# Patient Record
Sex: Female | Born: 1950
Health system: Southern US, Community
[De-identification: ages and names within clinical notes are randomized; demographics above are authoritative.]

## PROBLEM LIST (undated history)

## (undated) DIAGNOSIS — Z9889 Other specified postprocedural states: Secondary | ICD-10-CM

## (undated) DIAGNOSIS — T7840XA Allergy, unspecified, initial encounter: Secondary | ICD-10-CM

## (undated) DIAGNOSIS — N809 Endometriosis, unspecified: Secondary | ICD-10-CM

## (undated) DIAGNOSIS — K5790 Diverticulosis of intestine, part unspecified, without perforation or abscess without bleeding: Secondary | ICD-10-CM

## (undated) DIAGNOSIS — Z87448 Personal history of other diseases of urinary system: Secondary | ICD-10-CM

## (undated) DIAGNOSIS — F32A Depression, unspecified: Secondary | ICD-10-CM

## (undated) DIAGNOSIS — J302 Other seasonal allergic rhinitis: Secondary | ICD-10-CM

## (undated) DIAGNOSIS — N289 Disorder of kidney and ureter, unspecified: Secondary | ICD-10-CM

## (undated) DIAGNOSIS — R202 Paresthesia of skin: Secondary | ICD-10-CM

## (undated) DIAGNOSIS — I10 Essential (primary) hypertension: Secondary | ICD-10-CM

## (undated) DIAGNOSIS — D126 Benign neoplasm of colon, unspecified: Secondary | ICD-10-CM

## (undated) DIAGNOSIS — M1990 Unspecified osteoarthritis, unspecified site: Secondary | ICD-10-CM

## (undated) DIAGNOSIS — F329 Major depressive disorder, single episode, unspecified: Secondary | ICD-10-CM

## (undated) DIAGNOSIS — K529 Noninfective gastroenteritis and colitis, unspecified: Secondary | ICD-10-CM

## (undated) HISTORY — PX: URETERAL STENT PLACEMENT: SHX822

## (undated) HISTORY — DX: Other seasonal allergic rhinitis: J30.2

## (undated) HISTORY — DX: Paresthesia of skin: R20.2

## (undated) HISTORY — DX: Other specified postprocedural states: Z98.890

## (undated) HISTORY — DX: Endometriosis, unspecified: N80.9

## (undated) HISTORY — DX: Allergy, unspecified, initial encounter: T78.40XA

## (undated) HISTORY — DX: Unspecified osteoarthritis, unspecified site: M19.90

## (undated) HISTORY — DX: Disorder of kidney and ureter, unspecified: N28.9

## (undated) HISTORY — PX: DILATION AND CURETTAGE OF UTERUS: SHX78

## (undated) HISTORY — DX: Depression, unspecified: F32.A

## (undated) HISTORY — DX: Benign neoplasm of colon, unspecified: D12.6

## (undated) HISTORY — DX: Essential (primary) hypertension: I10

## (undated) HISTORY — DX: Personal history of other diseases of urinary system: Z87.448

## (undated) HISTORY — DX: Major depressive disorder, single episode, unspecified: F32.9

## (undated) HISTORY — DX: Diverticulosis of intestine, part unspecified, without perforation or abscess without bleeding: K57.90

---
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
## 1992-10-19 DIAGNOSIS — N809 Endometriosis, unspecified: Secondary | ICD-10-CM

## 1992-10-19 HISTORY — PX: ABDOMINAL HYSTERECTOMY: SHX81

## 1992-10-19 HISTORY — DX: Endometriosis, unspecified: N80.9

## 2000-12-10 ENCOUNTER — Encounter: Payer: Self-pay | Admitting: *Deleted

## 2000-12-10 ENCOUNTER — Ambulatory Visit (HOSPITAL_COMMUNITY): Admission: RE | Admit: 2000-12-10 | Discharge: 2000-12-10 | Payer: Self-pay | Admitting: *Deleted

## 2005-06-12 ENCOUNTER — Other Ambulatory Visit: Admission: RE | Admit: 2005-06-12 | Discharge: 2005-06-12 | Payer: Self-pay | Admitting: Family Medicine

## 2007-12-14 ENCOUNTER — Ambulatory Visit (HOSPITAL_COMMUNITY): Admission: RE | Admit: 2007-12-14 | Discharge: 2007-12-14 | Payer: Self-pay | Admitting: Family Medicine

## 2010-11-09 ENCOUNTER — Encounter: Payer: Self-pay | Admitting: Family Medicine

## 2012-07-19 ENCOUNTER — Encounter: Payer: Self-pay | Admitting: Family Medicine

## 2012-07-19 ENCOUNTER — Ambulatory Visit: Payer: Self-pay | Admitting: Family Medicine

## 2012-07-19 ENCOUNTER — Ambulatory Visit (INDEPENDENT_AMBULATORY_CARE_PROVIDER_SITE_OTHER): Payer: BC Managed Care – PPO | Admitting: Family Medicine

## 2012-07-19 VITALS — BP 130/78 | HR 66 | Temp 98.1°F | Resp 16 | Ht 61.0 in | Wt 119.6 lb

## 2012-07-19 DIAGNOSIS — Z720 Tobacco use: Secondary | ICD-10-CM

## 2012-07-19 DIAGNOSIS — R5383 Other fatigue: Secondary | ICD-10-CM

## 2012-07-19 DIAGNOSIS — I10 Essential (primary) hypertension: Secondary | ICD-10-CM

## 2012-07-19 MED ORDER — HYDROCHLOROTHIAZIDE 25 MG PO TABS: 25.0000 mg | ORAL_TABLET | Freq: Every day | ORAL | Status: DC

## 2012-07-19 NOTE — Progress Notes (Signed)
[address]   [place], [place]  [address]   [phone]  Subjective:    Patient ID: Kelly Norris, female    DOB: [birth year], 61 y.o.   MRN: [id number]  HPI This 61 y.o. female presents to establish care.    1.  Hypertension:  Sister is Danella Penton. Needs a new physician.  Today really just needs blood pressure medication refilled.    2. Fatigue:  Chronic issue for patient.  Etiology unclear.  S/p labs.  S/p stress testing for chest pain in past 1-2 years.  No apparent depression.      PMH: HTN; colitis? IBS? Psurg:  1.  Hysterecotmy tumor benign.  2.  Colonoscopy never. All: epinephrine (rash, tongue swelling),  Medications:  HCTZ Social: divored; not dating; would love to date.  +2 children; 2 grandchildren; works at [employer] x 1 year.  +tobacco 2-3 cigarettes per day.  No alcohol.  Exercise walking during lunch  Family:  M-lung mass/cancer with metastasis; died age 93.  Breast cancer in 40s.  Father-died 87; CHF.  Siblings--- sister with migraines, GERD, anxiety and depression, DDD cervical spine.  Brother-hypercholesterolemia, GERD, anxiety. Review of Systems  Constitutional: Positive for fatigue. Negative for chills, diaphoresis, activity change, appetite change and unexpected weight change.  HENT: Negative for congestion, rhinorrhea, sneezing, mouth sores, postnasal drip and sinus pressure.   Eyes: Negative for photophobia, pain and visual disturbance.  Respiratory: Negative for chest tightness, shortness of breath, wheezing and stridor.   Cardiovascular: Negative for chest pain, palpitations and leg swelling.  Gastrointestinal: Positive for abdominal pain, diarrhea and constipation. Negative for nausea, vomiting, blood in stool and anal bleeding.  Musculoskeletal: Negative for myalgias, back pain, joint swelling, arthralgias and gait problem.  Neurological: Negative for dizziness, tremors, seizures, syncope, facial asymmetry, weakness,  light-headedness, numbness and headaches.  Psychiatric/Behavioral: Negative for self-injury and dysphoric mood. The patient is not nervous/anxious.         Past Medical History  Diagnosis Date  . Hypertension     2004  . Osteoarthritis     cervical   . H/O hydronephrosis     left   . Paresthesias     thumb  . Kidney trouble     previous stent in the left kidney  . Seasonal allergies   . H/O breast biopsy     x 3  . Endometriosis 1994    Past Surgical History  Procedure Date  . Abdominal hysterectomy     Prior to Admission medications   Medication Sig Start Date End Date Taking? Authorizing Provider  fish oil-omega-3 fatty acids 1000 MG capsule Take 2 g by mouth daily. Take 1200 mg/day   Yes Historical Provider, MD  hydrochlorothiazide (HYDRODIURIL) 25 MG tablet Take 1 tablet (25 mg total) by mouth daily. Need refills 07/19/12  Yes Ethelda Chick, MD  Multiple Vitamins-Minerals (WOMENS 50+ ADVANCED PO) Take by mouth.   Yes Historical Provider, MD    Allergies  Allergen Reactions  . Epinephrine Other (See Comments)    Per pt start shaking and mouth clicks tongue gets fat  . Formaldehyde Hives and Other (See Comments)    Made in creams     History   Social History  . Marital Status: Divorced    Spouse Name: N/A    Number of Children: N/A  . Years of Education: N/A   Occupational History  . Not on file.   Social History Main Topics  . Smoking status: Current  Every Day Smoker    Types: Cigarettes  . Smokeless tobacco: Not on file     Comment: smoke 2-3 cigarettes/day  . Alcohol Use: No  . Drug Use: Not on file  . Sexually Active: Not on file   Other Topics Concern  . 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 She rarely uses alcohol. She has been divorced for over 16 years. She has 2 grown daughters.    Family History  Problem Relation Age of Onset  . Hypertension Mother   .  Breast cancer Mother     age 87  . Hypertension Father   . Prostate cancer Father   . Heart disease Father     bypass surgery in his late 4 's  . Valvular heart disease Daughter     valvular heart disease bacterial endocarditis but no premature history  of cardiac  disease    Objective:   Physical Exam  Nursing note and vitals reviewed. Constitutional: She is oriented to person, place, and time. She appears well-developed and well-nourished.  HENT:  Head: Normocephalic and atraumatic.  Right Ear: External ear normal.  Left Ear: External ear normal.  Nose: Nose normal.  Mouth/Throat: Oropharynx is clear and moist.  Eyes: Conjunctivae normal and EOM are normal. Pupils are equal, round, and reactive to light.  Neck: Normal range of motion. Neck supple. No JVD present. No thyromegaly present.  Cardiovascular: Normal rate, regular rhythm, normal heart sounds and intact distal pulses.  Exam reveals no gallop and no friction rub.   No murmur heard. Pulmonary/Chest: Effort normal and breath sounds normal.  Abdominal: Soft. Bowel sounds are normal. She exhibits no mass. There is no tenderness. There is no rebound and no guarding.  Lymphadenopathy:    She has no cervical adenopathy.  Neurological: She is alert and oriented to person, place, and time. She has normal reflexes. No cranial nerve deficit. She exhibits normal muscle tone. Coordination normal.  Skin: Skin is warm and dry. No rash noted. No erythema.  Psychiatric: She has a normal mood and affect. Her behavior is normal. Judgment and thought content normal.      Assessment & Plan:

## 2012-07-19 NOTE — Patient Instructions (Addendum)
1. Hypertension  hydrochlorothiazide (HYDRODIURIL) 25 MG tablet

## 2012-09-23 ENCOUNTER — Encounter: Payer: Self-pay | Admitting: Family Medicine

## 2012-09-23 DIAGNOSIS — I10 Essential (primary) hypertension: Secondary | ICD-10-CM | POA: Insufficient documentation

## 2012-09-23 DIAGNOSIS — Z87891 Personal history of nicotine dependence: Secondary | ICD-10-CM | POA: Insufficient documentation

## 2012-09-23 DIAGNOSIS — R5383 Other fatigue: Secondary | ICD-10-CM | POA: Insufficient documentation

## 2012-09-23 NOTE — Assessment & Plan Note (Signed)
Precontemplative

## 2012-09-23 NOTE — Assessment & Plan Note (Signed)
New.  S/p labs, cardiac evaluation without etiology revealed.  RTC in 3-6 months to discuss further.  Obtain records.

## 2012-09-23 NOTE — Assessment & Plan Note (Signed)
Controlled; no change in management; obtain labs next visit.

## 2012-09-24 NOTE — Progress Notes (Signed)
Reviewed and agree.

## 2013-01-24 ENCOUNTER — Telehealth: Payer: Self-pay

## 2013-01-24 DIAGNOSIS — I10 Essential (primary) hypertension: Secondary | ICD-10-CM

## 2013-01-24 NOTE — Telephone Encounter (Signed)
Dr. Katrinka Blazing: patient called regarding why she only had a 3 month prescription for her Blood pressure meds. She wanted to book a CPE to get refills but you are booked out on physicals until around July.   I did offer her an office visit but she wanted her questions answered first. Please call her at home after 6:30 at [phone] to discuss why she did not get a prescription for at least 6 months. Thank you

## 2013-01-26 MED ORDER — HYDROCHLOROTHIAZIDE 25 MG PO TABS: 25.0000 mg | ORAL_TABLET | Freq: Every day | ORAL | Status: DC

## 2013-01-26 NOTE — Telephone Encounter (Signed)
Call --- at her visit in 07/2012, I provided her with 90 day supply of blood pressure medication with one refill which should have lasted her a total of 6 months (which is now).  She is due for six month follow-up for high blood pressure.  It is my policy to follow all patients with high blood pressure every six months minimum.  I am happy to give her an additional refill until July if she wants to schedule a  Physical; but for the future, she will need visits every six months.

## 2013-01-26 NOTE — Telephone Encounter (Signed)
Called her , left message for her to call me back, sent in Rx for her to last until July

## 2013-01-27 NOTE — Telephone Encounter (Signed)
Left another message for her, to call me

## 2013-01-30 NOTE — Telephone Encounter (Signed)
Left another message, patient not returning my calls.

## 2015-05-09 ENCOUNTER — Emergency Department
Admission: EM | Admit: 2015-05-09 | Discharge: 2015-05-09 | Disposition: A | Discharge status: Home / Self Care | Payer: Commercial Managed Care - PPO | Attending: Emergency Medicine | Admitting: Emergency Medicine

## 2015-05-09 DIAGNOSIS — R233 Spontaneous ecchymoses: Secondary | ICD-10-CM | POA: Diagnosis present

## 2015-05-09 DIAGNOSIS — Z79899 Other long term (current) drug therapy: Secondary | ICD-10-CM | POA: Insufficient documentation

## 2015-05-09 DIAGNOSIS — I10 Essential (primary) hypertension: Secondary | ICD-10-CM | POA: Insufficient documentation

## 2015-05-09 DIAGNOSIS — Z87891 Personal history of nicotine dependence: Secondary | ICD-10-CM | POA: Diagnosis not present

## 2015-05-09 NOTE — ED Provider Notes (Signed)
[facility] Emergency Department Provider Note  ____________________________________________  Time seen: 7:30 PM  I have reviewed the triage vital signs and the nursing notes.   HISTORY  Chief Complaint Bleeding/Bruising    HPI Kelly Norris is a 64 y.o. female who complains of discoloration of the left third finger for the past 3 days. She notes that she has had this problem in the past that seems to occur spontaneously and has affected all of her fingers at different points. At the beginning, she felt like there was stiffness and swelling in the PIP joint of the left third finger with a small palpable bulge, and then at one point it felt like there was a pop and then subsequently dark reddish discoloration of the entire finger which has tracked down into the distal palm at the base of the finger. No coldness or pain in the finger. No fever or chills. She has full range of motion in the finger. No recent trauma that she can relate.  She does report a history of hemophilia like symptoms including easy bruising and severe bruising with minor trauma. She has been tested for hemophilia which was negative, but notes that she is "a free bleeder".She also reports that when this happens in the fingers, the reddish discoloration usually goes through a rainbow of colors as it involves before finally goes away. She does not take any aspirin or blood thinners    Past Medical History  Diagnosis Date  . Hypertension     2004  . Osteoarthritis     cervical   . H/O hydronephrosis     left   . Paresthesias     thumb  . Kidney trouble     previous stent in the left kidney  . Seasonal allergies   . H/O breast biopsy     x 3  . Endometriosis 1994    Patient Active Problem List   Diagnosis Date Noted  . Hypertension 09/23/2012  . Fatigue 09/23/2012  . Tobacco abuse 09/23/2012    Past Surgical History  Procedure Laterality Date  . Abdominal hysterectomy       Current Outpatient Rx  Name  Route  Sig  Dispense  Refill  . fish oil-omega-3 fatty acids 1000 MG capsule   Oral   Take 2 g by mouth daily. Take 1200 mg/day         . hydrochlorothiazide (HYDRODIURIL) 25 MG tablet   Oral   Take 1 tablet (25 mg total) by mouth daily.   90 tablet   0     Needs office visit for refills.   . Multiple Vitamins-Minerals (WOMENS 50+ ADVANCED PO)   Oral   Take by mouth.           Allergies Epinephrine and Formaldehyde  Family History  Problem Relation Age of Onset  . Hypertension Mother   . Breast cancer Mother     age 9  . Hypertension Father   . Prostate cancer Father   . Heart disease Father     bypass surgery in his late 53 's  . Valvular heart disease Daughter     valvular heart disease bacterial endocarditis but no premature history  of cardiac  disease    Social History History  Substance Use Topics  . Smoking status: Former Smoker    Types: Cigarettes  . Smokeless tobacco: Not on file     Comment: smoke 2-3 cigarettes/day  . Alcohol Use: No    Review of  Systems  Constitutional: No fever or chills. No weight changes Eyes:No blurry vision or double vision.  ENT: No sore throat. Cardiovascular: No chest pain. Respiratory: No dyspnea or cough. Gastrointestinal: Negative for abdominal pain, vomiting and diarrhea.  No BRBPR or melena. Genitourinary: Negative for dysuria, urinary retention, bloody urine, or difficulty urinating. Musculoskeletal: Negative for back pain. Left third finger swelling and discoloration as above Skin: Negative for rash. Neurological: Negative for headaches, focal weakness or numbness. Psychiatric:No anxiety or depression.   Endocrine:No hot/cold intolerance, changes in energy, or sleep difficulty.  10-point ROS otherwise negative.  ____________________________________________   PHYSICAL EXAM:  VITAL SIGNS: ED Triage Vitals  Enc Vitals Group     BP 05/09/15 1926 145/91 mmHg      Pulse Rate 05/09/15 1926 83     Resp 05/09/15 1926 18     Temp 05/09/15 1930 98.4 F (36.9 C)     Temp Source 05/09/15 1930 Oral     SpO2 05/09/15 1926 97 %     Weight 05/09/15 1926 130 lb (58.968 kg)     Height 05/09/15 1926 5\' 1"  (1.549 m)     Head Cir --      Peak Flow --      Pain Score 05/09/15 1929 0     Pain Loc --      Pain Edu? --      Excl. in [place]? --      Constitutional: Alert and oriented. Well appearing and in no distress. Musculoskeletal: Nontender with normal range of motion in all extremities. No joint effusions.  The right hand is normal. The left hand has no swelling or bony tenderness. There is full range of motion in all joints and full flexor and extensor function at all joints including in the left third finger. There is discoloration of the left third finger from the proximal end of the nailbed all the way into the distal palm overlying the area of the MCP joint.  There is normal cap refill in the nail bed and on the fingertip. All joints are stable and mobile. The skin is warm and dry.  Neurologic:   Motor and sensation intact Skin:  Skin is warm, dry and intact. No rash noted.  No petechiae, purpura, or bullae. Psychiatric: Mood and affect are normal. Speech and behavior are normal. Patient exhibits appropriate insight and judgment.  ____________________________________________    LABS (pertinent positives/negatives) (all labs ordered are listed, but only abnormal results are displayed) Labs Reviewed - No data to display ____________________________________________   EKG    ____________________________________________    RADIOLOGY    ____________________________________________   PROCEDURES  ____________________________________________   INITIAL IMPRESSION / ASSESSMENT AND PLAN / ED COURSE  Pertinent labs & imaging results that were available during my care of the patient were reviewed by me and considered in my medical decision making  (see chart for details).  Patient presents with discoloration of the left third finger which does not appear to be fracture dislocation tenosynovitis or other soft tissue infection. It actually appears to be consistent with a spontaneous subcutaneous hemorrhage that has tracked along the tendon sheaths causing a ecchymotic discoloration of the full finger. There is no evidence of compartment syndrome. I counseled the patient to use hot compresses to speed the breakdown of blood products. This appears to be a self-limited issue that will resolve on its own.  ____________________________________________   FINAL CLINICAL IMPRESSION(S) / ED DIAGNOSES  Final diagnoses:  Spontaneous bruising   of the  left third finger    Carrie Mew, MD 05/09/15 [phone]

## 2015-05-09 NOTE — ED Notes (Signed)
Patient has hx of the same. Has happened to all other fingers but never the thumbs. PA at Urgent Care suggested she come here for ultrasound.

## 2015-11-05 ENCOUNTER — Telehealth: Payer: Self-pay | Admitting: Family Medicine

## 2015-11-05 NOTE — Telephone Encounter (Signed)
Ok to schedule. Thanks

## 2015-11-05 NOTE — Telephone Encounter (Signed)
This lady wants to know if you would consider taking her on as a new patient.  Currently she is taking medication for high blood pressure.   She also has an issue with bruising and free bleeding but does take medication for that.  She needs a primary care provider.  She use to go to [facility] but the provider she seen there has left..    She does not need to be seen right away March or April is ok.   Her call back is [phone]  Thanks Con Memos

## 2015-11-05 NOTE — Telephone Encounter (Signed)
Please review. Thanks!  

## 2015-11-06 NOTE — Telephone Encounter (Signed)
Pt is coming in March 23 to establish care.

## 2016-01-09 ENCOUNTER — Ambulatory Visit (INDEPENDENT_AMBULATORY_CARE_PROVIDER_SITE_OTHER): Payer: Managed Care, Other (non HMO) | Admitting: Family Medicine

## 2016-01-09 ENCOUNTER — Encounter: Payer: Self-pay | Admitting: Family Medicine

## 2016-01-09 ENCOUNTER — Other Ambulatory Visit: Payer: Self-pay

## 2016-01-09 VITALS — BP 122/82 | HR 80 | Temp 98.1°F | Resp 16 | Ht 62.0 in | Wt 139.0 lb

## 2016-01-09 DIAGNOSIS — E038 Other specified hypothyroidism: Secondary | ICD-10-CM

## 2016-01-09 DIAGNOSIS — G4700 Insomnia, unspecified: Secondary | ICD-10-CM

## 2016-01-09 DIAGNOSIS — R195 Other fecal abnormalities: Secondary | ICD-10-CM | POA: Diagnosis not present

## 2016-01-09 DIAGNOSIS — I10 Essential (primary) hypertension: Secondary | ICD-10-CM | POA: Diagnosis not present

## 2016-01-09 DIAGNOSIS — E039 Hypothyroidism, unspecified: Secondary | ICD-10-CM

## 2016-01-09 DIAGNOSIS — Z1211 Encounter for screening for malignant neoplasm of colon: Secondary | ICD-10-CM | POA: Diagnosis not present

## 2016-01-09 MED ORDER — HYDROCHLOROTHIAZIDE 25 MG PO TABS: ORAL_TABLET | ORAL | Status: DC

## 2016-01-09 MED ORDER — AMITRIPTYLINE HCL 25 MG PO TABS: 25.0000 mg | ORAL_TABLET | Freq: Every day | ORAL | Status: DC

## 2016-01-09 NOTE — Progress Notes (Signed)
Patient ID: Kelly Norris, female   DOB: [birth year], 65 y.o.   MRN: [id number]       [name] Female    DOB: 01-16-1951   65 y.o.   MRN: [id number] Visit Date: 01/09/2016  Today's Provider: Margarita Rana, MD   Chief Complaint  Patient presents with  . New Patient (Initial Visit)  . Hypertension  . 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   Hypertension, follow-up:  BP Readings from Last 3 Encounters:  01/09/16 122/82  05/09/15 145/91  07/19/12 130/78    She was last seen for hypertension 1 years ago. Patient was seen by Dionisio David PA Management changes since that visit include na changes. She reports excellent compliance with treatment. She is not having side effects.  She is not exercising. She is adherent to low salt diet.   Outside blood pressures are not being checked. She is experiencing lower extremity edema.  Patient denies chest pain.   Cardiovascular risk factors include none.  Use of agents associated with hypertension: none.     Weight trend: increasing steadily Wt Readings from Last 3 Encounters:  01/09/16 139 lb (63.05 kg)  05/09/15 130 lb (58.968 kg)  07/19/12 119 lb 9.6 oz (54.25 kg)    Current diet: in general, a "healthy" diet    ------------------------------------------------------------------------   Depression, Follow-up  She  was last seen for this 1 years ago. Patient was seen by Dionisio David PA. Changes made at last visit include decreasing Amitriptyline to 25 mg daily.   She reports excellent compliance with treatment. She is not having side effects.  She reports excellent tolerance of treatment. Current symptoms include: insomnia She feels she is Unchanged since last visit. Patient reports that she started taking Amitriptyline for depression and now is taking it for  insomnia. ------------------------------------------------------------------------  Abnormal bowel movents: Patient reports that she has a history of colitis, diagnosed by Dr. Shon Baton in her early 39. Patient reports diarrhea and loose stools daily and a lot of gas. Patient denies blood in stool today. Patient had a bad flare up of colitis in October 2016. Patient reports she takes OTC probiotic twice a day. Patient reports improvement of symptoms with probiotics. Patient concerned about IBS. Patient reports that she had a Cologuard done last year but has not called her previous doctor's office for result.      Allergies  Allergen Reactions  . Epinephrine Other (See Comments)    Per pt start shaking and mouth clicks tongue gets fat  . Formaldehyde Hives and Other (See Comments)    Made in creams    Previous Medications   AMITRIPTYLINE (ELAVIL) 25 MG TABLET    TK 1 T PO QHS   FISH OIL-OMEGA-3 FATTY ACIDS 1000 MG CAPSULE    Take 1 capsule by mouth daily. Take 1200 mg/day   HYDROCHLOROTHIAZIDE (HYDRODIURIL) 25 MG TABLET    Take 1 tablet (25 mg total) by mouth daily.   MULTIPLE VITAMINS-MINERALS (WOMENS 50+ ADVANCED PO)    Take 1 tablet by mouth daily.     Review of Systems  Constitutional: Positive for appetite change, fatigue and unexpected weight change.  HENT: Positive for congestion, postnasal drip, rhinorrhea and sinus pressure.   Eyes: Positive for photophobia.  Gastrointestinal: Positive for diarrhea and abdominal distention.  Musculoskeletal: Positive for joint swelling, arthralgias and neck stiffness.  Allergic/Immunologic: Positive for environmental allergies.  Hematological: Bruises/bleeds easily.  Psychiatric/Behavioral: Positive for sleep disturbance.  Social History  Substance Use Topics  . Smoking status: Former Smoker -- 20 years    Types: Cigarettes    Quit date: 10/18/2013  . Smokeless tobacco: Never Used     Comment: smoke 2-3 cigarettes/day  . Alcohol Use: No    Objective:   BP 122/82 mmHg  Pulse 80  Temp(Src) 98.1 F (36.7 C) (Oral)  Resp 16  Ht 5\' 2"  (1.575 m)  Wt 139 lb (63.05 kg)  BMI 25.42 kg/m2  Physical Exam  Constitutional: She appears well-developed and well-nourished.  Cardiovascular: Normal rate, regular rhythm and normal heart sounds.   Pulmonary/Chest: Effort normal and breath sounds normal.  Psychiatric: She has a normal mood and affect. Her behavior is normal. Judgment and thought content normal.      Assessment & Plan:     1. Essential hypertension Stable. Patient advised to continue current medication and plan of care. - CBC with Differential/Platelet - Comprehensive metabolic panel - hydrochlorothiazide (HYDRODIURIL) 25 MG tablet; Take 1 tablet daily  Dispense: 90 tablet; Refill: 3  2. Insomnia Chronic. Worsening. Patient advised to increase amitriptyline 25 mg as below. - amitriptyline (ELAVIL) 25 MG tablet; Take 1 tablet (25 mg total) by mouth at bedtime. Take 1 1/2 tablet every night  Dispense: 135 tablet; Refill: 3  3. Abnormal stools Stable. Patient to continue taking OTC probiotic daily.  4. Colon cancer screening - Ambulatory referral to Gastroenterology  5. Subclinical hypothyroidism - T4 AND TSH   Patient seen and examined by Dr. Jerrell Belfast, and note scribed by Philbert Riser. Dimas, CMA.  I have reviewed the document for accuracy and completeness and I agree with above. - Jerrell Belfast, MD     Margarita Rana, MD  [facility]

## 2016-01-10 ENCOUNTER — Telehealth: Payer: Self-pay

## 2016-01-10 ENCOUNTER — Telehealth: Payer: Self-pay | Admitting: Family Medicine

## 2016-01-10 ENCOUNTER — Other Ambulatory Visit: Payer: Self-pay

## 2016-01-10 LAB — CBC WITH DIFFERENTIAL/PLATELET
BASOS: 1 %
Basophils Absolute: 0 10*3/uL (ref 0.0–0.2)
EOS (ABSOLUTE): 0.2 10*3/uL (ref 0.0–0.4)
Eos: 4 %
HEMATOCRIT: 41.6 % (ref 34.0–46.6)
Hemoglobin: 14.4 g/dL (ref 11.1–15.9)
Immature Grans (Abs): 0 10*3/uL (ref 0.0–0.1)
Immature Granulocytes: 0 %
LYMPHS ABS: 1.9 10*3/uL (ref 0.7–3.1)
Lymphs: 33 %
MCH: 30.9 pg (ref 26.6–33.0)
MCHC: 34.6 g/dL (ref 31.5–35.7)
MCV: 89 fL (ref 79–97)
MONOS ABS: 0.4 10*3/uL (ref 0.1–0.9)
Monocytes: 6 %
NEUTROS ABS: 3.3 10*3/uL (ref 1.4–7.0)
NEUTROS PCT: 56 %
PLATELETS: 287 10*3/uL (ref 150–379)
RBC: 4.66 x10E6/uL (ref 3.77–5.28)
RDW: 13.5 % (ref 12.3–15.4)
WBC: 5.8 10*3/uL (ref 3.4–10.8)

## 2016-01-10 LAB — COMPREHENSIVE METABOLIC PANEL
A/G RATIO: 2 (ref 1.2–2.2)
ALT: 14 IU/L (ref 0–32)
AST: 21 IU/L (ref 0–40)
Albumin: 4.7 g/dL (ref 3.6–4.8)
Alkaline Phosphatase: 82 IU/L (ref 39–117)
BILIRUBIN TOTAL: 0.5 mg/dL (ref 0.0–1.2)
BUN/Creatinine Ratio: 20 (ref 11–26)
BUN: 14 mg/dL (ref 8–27)
CHLORIDE: 98 mmol/L (ref 96–106)
CO2: 29 mmol/L (ref 18–29)
Calcium: 10.2 mg/dL (ref 8.7–10.3)
Creatinine, Ser: 0.7 mg/dL (ref 0.57–1.00)
GFR calc Af Amer: 107 mL/min/{1.73_m2} (ref >59)
GFR calc non Af Amer: 93 mL/min/{1.73_m2} (ref >59)
GLOBULIN, TOTAL: 2.4 g/dL (ref 1.5–4.5)
Glucose: 97 mg/dL (ref 65–99)
POTASSIUM: 3.5 mmol/L (ref 3.5–5.2)
SODIUM: 145 mmol/L — AB (ref 134–144)
Total Protein: 7.1 g/dL (ref 6.0–8.5)

## 2016-01-10 LAB — T4 AND TSH
T4 TOTAL: 11.6 ug/dL (ref 4.5–12.0)
TSH: 1.81 u[IU]/mL (ref 0.450–4.500)

## 2016-01-10 NOTE — Telephone Encounter (Signed)
Pt advised.   Thanks,   -Laura  

## 2016-01-10 NOTE — Telephone Encounter (Signed)
Pt is returning call.  CB#[phone]/MW

## 2016-01-10 NOTE — Telephone Encounter (Signed)
Gastroenterology Pre-Procedure Review  Request Date: 02/25/16 Requesting Physician: Dr. Venia Minks  PATIENT REVIEW QUESTIONS: The patient responded to the following health history questions as indicated:    1. Are you having any GI issues? no 2. Do you have a personal history of Polyps? no 3. Do you have a family history of Colon Cancer or Polyps? no 4. Diabetes Mellitus? no 5. Joint replacements in the past 12 months?no 6. Major health problems in the past 3 months?no 7. Any artificial heart valves, MVP, or defibrillator?no    MEDICATIONS & ALLERGIES:    Patient reports the following regarding taking any anticoagulation/antiplatelet therapy:   Plavix, Coumadin, Eliquis, Xarelto, Lovenox, Pradaxa, Brilinta, or Effient? no Aspirin? no  Patient confirms/reports the following medications:  Current Outpatient Prescriptions  Medication Sig Dispense Refill  . amitriptyline (ELAVIL) 25 MG tablet Take 1 tablet (25 mg total) by mouth at bedtime. Take 1 1/2 tablet every night 135 tablet 1  . fish oil-omega-3 fatty acids 1000 MG capsule Take 1 capsule by mouth daily. Take 1200 mg/day    . hydrochlorothiazide (HYDRODIURIL) 25 MG tablet Take 1 tablet daily. 90 tablet 3  . Multiple Vitamins-Minerals (WOMENS 50+ ADVANCED PO) Take 1 tablet by mouth daily.      No current facility-administered medications for this visit.    Patient confirms/reports the following allergies:  Allergies  Allergen Reactions  . Epinephrine Other (See Comments)    Per pt start shaking and mouth clicks tongue gets fat  . Formaldehyde Hives and Other (See Comments)    Made in creams     No orders of the defined types were placed in this encounter.    AUTHORIZATION INFORMATION Primary Insurance: 1D#: Group #:  Secondary Insurance: 1D#: Group #:  SCHEDULE INFORMATION: Date: 02/25/16 Time: Location: [facility]

## 2016-01-10 NOTE — Telephone Encounter (Signed)
-----   Message from Margarita Rana, MD sent at 01/10/2016  8:54 AM EDT ----- Labs normal. Please notify patient. Thanks.

## 2016-01-10 NOTE — Telephone Encounter (Signed)
LMTCB Heaven Meeker Drozdowski, CMA  

## 2016-02-06 ENCOUNTER — Telehealth: Payer: Self-pay | Admitting: Gastroenterology

## 2016-02-06 NOTE — Telephone Encounter (Signed)
Patient is cancelling her colonoscopy on 5/9. She wants to talk to Dr. Allen Norris first so I scheduled her on 5/17. I didn't cancel anything

## 2016-02-12 NOTE — Telephone Encounter (Signed)
Cancelled procedure at [facility].

## 2016-02-25 ENCOUNTER — Ambulatory Visit
Admission: RE | Admit: 2016-02-25 | Payer: Commercial Managed Care - PPO | Source: Ambulatory Visit | Admitting: Gastroenterology

## 2016-02-25 ENCOUNTER — Encounter: Admission: RE | Payer: Self-pay | Source: Ambulatory Visit

## 2016-02-25 SURGERY — COLONOSCOPY WITH PROPOFOL
Anesthesia: Choice

## 2016-03-04 ENCOUNTER — Encounter (INDEPENDENT_AMBULATORY_CARE_PROVIDER_SITE_OTHER): Payer: Self-pay

## 2016-03-04 ENCOUNTER — Encounter: Payer: Self-pay | Admitting: Gastroenterology

## 2016-03-04 ENCOUNTER — Ambulatory Visit (INDEPENDENT_AMBULATORY_CARE_PROVIDER_SITE_OTHER): Payer: Managed Care, Other (non HMO) | Admitting: Gastroenterology

## 2016-03-04 ENCOUNTER — Other Ambulatory Visit: Payer: Self-pay

## 2016-03-04 VITALS — BP 144/81 | HR 76 | Temp 98.5°F | Ht 61.0 in | Wt 137.0 lb

## 2016-03-04 DIAGNOSIS — R197 Diarrhea, unspecified: Secondary | ICD-10-CM | POA: Diagnosis not present

## 2016-03-04 DIAGNOSIS — K580 Irritable bowel syndrome with diarrhea: Secondary | ICD-10-CM | POA: Diagnosis not present

## 2016-03-04 NOTE — Progress Notes (Signed)
Gastroenterology Consultation  Referring Provider:     Margarita Rana, MD Primary Care Physician:  Margarita Rana, MD Primary Gastroenterologist:  Dr. Allen Norris     Reason for Consultation:     Diarrhea        HPI:   Kelly Norris is a 65 y.o. y/o female referred for consultation & management of Diarrhea by Dr. Margarita Rana, MD.  This patient comes in today with a history of diarrhea for many years. The patient states she has been controlled in the past I her amitriptyline. The patient has also been on probiotics which have kept her symptoms under control. The patient has had episodes in her life where she was treated with antibiotics such as amoxicillin that she had a very hard time with that getting her symptoms under control again. The patient was recently treated by a dentist with clarithromycin and she states that that caused her to have the symptoms of-year-old bowel syndrome with diarrhea again. The patient had her amitriptyline increased to one and a half pills per night but she states she still has not gone back to her normal self. The patient also reports that she has never had a colonoscopy but does have a family history of colon polyps and a brother with ulcerative colitis. The patient denies any rectal bleeding unexplained weight loss fevers chills nausea or vomiting.  Past Medical History  Diagnosis Date  . Hypertension     2004  . Osteoarthritis     cervical   . H/O hydronephrosis     left   . Paresthesias     thumb  . Kidney trouble     previous stent in the left kidney  . Seasonal allergies   . H/O breast biopsy     x 3  . Endometriosis 1994  . Depression   . Allergy     Past Surgical History  Procedure Laterality Date  . Abdominal hysterectomy    . Dilation and curettage of uterus  92-94    2 times between 92-94    Prior to Admission medications   Medication Sig Start Date End Date Taking? Authorizing Provider  amitriptyline (ELAVIL) 25 MG tablet Take  1 tablet (25 mg total) by mouth at bedtime. Take 1 1/2 tablet every night 01/09/16  Yes Margarita Rana, MD  fish oil-omega-3 fatty acids 1000 MG capsule Take 1 capsule by mouth daily. Take 1200 mg/day   Yes Historical Provider, MD  Ginkgo Biloba 40 MG TABS Take by mouth.   Yes Historical Provider, MD  hydrochlorothiazide (HYDRODIURIL) 25 MG tablet Take 1 tablet daily. 01/09/16  Yes Margarita Rana, MD  Multiple Vitamins-Minerals (WOMENS 50+ ADVANCED PO) Take 1 tablet by mouth daily.    Yes Historical Provider, MD  Probiotic Product (SOLUBLE FIBER/PROBIOTICS PO) Take by mouth.   Yes Historical Provider, MD    Family History  Problem Relation Age of Onset  . Hypertension Mother   . Breast cancer Mother     age 81  . Cancer Mother   . Colon polyps Mother   . Depression Mother   . Hypertension Father   . Prostate cancer Father   . Heart disease Father     bypass surgery in his late 93 's  . Colon polyps Father   . Valvular heart disease Daughter     valvular heart disease bacterial endocarditis but no premature history  of cardiac  disease  . Colon polyps Sister   . Colon polyps Brother   .  Colon polyps Brother      Social History  Substance Use Topics  . Smoking status: Former Smoker -- 20 years    Types: Cigarettes    Quit date: 10/18/2013  . Smokeless tobacco: Never Used     Comment: smoke 2-3 cigarettes/day  . Alcohol Use: No    Allergies as of 03/04/2016 - Review Complete 03/04/2016  Allergen Reaction Noted  . Epinephrine Other (See Comments) 03/31/2012  . Formaldehyde Hives and Other (See Comments) 07/19/2012    Review of Systems:    All systems reviewed and negative except where noted in HPI.   Physical Exam:  BP 144/81 mmHg  Pulse 76  Temp(Src) 98.5 F (36.9 C) (Oral)  Ht 5\' 1"  (1.549 m)  Wt 137 lb (62.143 kg)  BMI 25.90 kg/m2 No LMP recorded. Patient is postmenopausal. Psych:  Alert and cooperative. Normal mood and affect. General:   Alert,  Well-developed,  well-nourished, pleasant and cooperative in NAD Head:  Normocephalic and atraumatic. Eyes:  Sclera clear, no icterus.   Conjunctiva pink. Ears:  Normal auditory acuity. Nose:  No deformity, discharge, or lesions. Mouth:  No deformity or lesions,oropharynx pink & moist. Neck:  Supple; no masses or thyromegaly. Lungs:  Respirations even and unlabored.  Clear throughout to auscultation.   No wheezes, crackles, or rhonchi. No acute distress. Heart:  Regular rate and rhythm; no murmurs, clicks, rubs, or gallops. Abdomen:  Normal bowel sounds.  No bruits.  Soft, non-tender and non-distended without masses, hepatosplenomegaly or hernias noted.  No guarding or rebound tenderness.  Negative Carnett sign.   Rectal:  Deferred.  Msk:  Symmetrical without gross deformities.  Good, equal movement & strength bilaterally. Pulses:  Normal pulses noted. Extremities:  No clubbing or edema.  No cyanosis. Neurologic:  Alert and oriented x3;  grossly normal neurologically. Skin:  Intact without significant lesions or rashes.  No jaundice. Lymph Nodes:  No significant cervical adenopathy. Psych:  Alert and cooperative. Normal mood and affect.  Imaging Studies: No results found.  Assessment and Plan:   Kelly Norris is a 65 y.o. y/o female who has a history of what sounds like to be irritable bowel syndrome. The patient has never had a colonoscopy but reports that she has a family history of colon polyps. The patient also has diarrhea that has not gotten better despite what she has used in the past for her irritable bowel syndrome. The patient has been told to take Imodium for her diarrhea and continue the amitriptyline. Will also be set up for colonoscopy. The patient is concerned because she has a tendency to bleed after small procedures and was worried that if I take off polyps she may have bleeding. The patient has been explained that we will keep this in mind when doing her procedure. I have discussed  risks & benefits which include, but are not limited to, bleeding, infection, perforation & drug reaction.  The patient agrees with this plan & written consent will be obtained.      Note: This dictation was prepared with Dragon dictation along with smaller phrase technology. Any transcriptional errors that result from this process are unintentional.

## 2016-03-24 ENCOUNTER — Telehealth: Payer: Self-pay | Admitting: Gastroenterology

## 2016-03-24 NOTE — Telephone Encounter (Signed)
Patient called and cancelled her colonoscopy on 6/19. No reason given and she did not want to reschedule

## 2016-03-24 NOTE — Telephone Encounter (Signed)
Procedure cancelled with [place].

## 2016-04-06 ENCOUNTER — Ambulatory Visit
Admission: RE | Admit: 2016-04-06 | Payer: Commercial Managed Care - PPO | Source: Ambulatory Visit | Admitting: Gastroenterology

## 2016-04-06 ENCOUNTER — Encounter: Admission: RE | Payer: Self-pay | Source: Ambulatory Visit

## 2016-04-06 SURGERY — COLONOSCOPY WITH PROPOFOL
Anesthesia: Choice

## 2016-05-27 NOTE — Telephone Encounter (Signed)
error 

## 2016-07-10 ENCOUNTER — Ambulatory Visit (INDEPENDENT_AMBULATORY_CARE_PROVIDER_SITE_OTHER): Payer: Managed Care, Other (non HMO) | Admitting: Physician Assistant

## 2016-07-10 ENCOUNTER — Encounter: Payer: Self-pay | Admitting: Physician Assistant

## 2016-07-10 ENCOUNTER — Ambulatory Visit: Payer: Managed Care, Other (non HMO) | Admitting: Physician Assistant

## 2016-07-10 VITALS — BP 110/80 | HR 80 | Temp 98.3°F | Resp 16 | Ht 61.0 in | Wt 125.0 lb

## 2016-07-10 DIAGNOSIS — Z23 Encounter for immunization: Secondary | ICD-10-CM

## 2016-07-10 DIAGNOSIS — Z0000 Encounter for general adult medical examination without abnormal findings: Secondary | ICD-10-CM | POA: Diagnosis not present

## 2016-07-10 DIAGNOSIS — G4700 Insomnia, unspecified: Secondary | ICD-10-CM | POA: Diagnosis not present

## 2016-07-10 DIAGNOSIS — I10 Essential (primary) hypertension: Secondary | ICD-10-CM | POA: Diagnosis not present

## 2016-07-10 DIAGNOSIS — K529 Noninfective gastroenteritis and colitis, unspecified: Secondary | ICD-10-CM

## 2016-07-10 MED ORDER — HYDROCHLOROTHIAZIDE 25 MG PO TABS: ORAL_TABLET | ORAL | 3 refills | Status: DC

## 2016-07-10 MED ORDER — AMITRIPTYLINE HCL 25 MG PO TABS: 25.0000 mg | ORAL_TABLET | Freq: Every day | ORAL | 3 refills | Status: DC

## 2016-07-10 NOTE — Progress Notes (Signed)
[name], Female    DOB: 05-Jul-1951, 65 y.o.   MRN: [id number] Visit Date: 07/10/2016  Today's Provider: Mar Daring, PA-C   Chief Complaint  Patient presents with  . Annual Exam   Subjective:    Annual physical exam Kelly Norris is a 65 y.o. female who presents today for health maintenance and complete physical. She feels well. She reports exercising active with daily activites. She reports she is sleeping well.  12/20/07 Mammogram-BI-RADS 1; wants mammogram done at [facility] hospital in [place] in January. -----------------------------------------------------------------   Review of Systems  Constitutional: Positive for activity change and fatigue.  HENT: Positive for ear discharge, postnasal drip and sinus pressure.   Eyes: Positive for redness and itching.  Respiratory: Negative.   Cardiovascular: Negative.   Gastrointestinal: Positive for abdominal distention and diarrhea.  Endocrine: Positive for polydipsia.  Genitourinary: Negative.   Musculoskeletal: Positive for neck stiffness.  Allergic/Immunologic: Positive for environmental allergies.  Neurological: Positive for tremors.  Hematological: Bruises/bleeds easily.  Psychiatric/Behavioral: Negative.     Social History      She  reports that she quit smoking about 2 years ago. Her smoking use included Cigarettes. She quit after 20.00 years of use. She has never used smokeless tobacco. She reports that she does not drink alcohol or use drugs.       Social History   Social History  . Marital status: Divorced    Spouse name: N/A  . Number of children: N/A  . Years of education: N/A   Social History Main Topics  . Smoking status: Former Smoker    Years: 20.00    Types: Cigarettes    Quit date: 10/18/2013  . Smokeless tobacco: Never Used     Comment: smoke 2-3 cigarettes/day  . Alcohol use No  . Drug use: No  . Sexual activity: Not Asked   Other Topics Concern    . 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 She rarely uses alcohol. She has been divorced for over 16 years. She has 2 grown daughters.          Past Medical History:  Diagnosis Date  . Allergy   . Depression   . Endometriosis 1994  . H/O breast biopsy    x 3  . H/O hydronephrosis    left   . Hypertension    2004  . Kidney trouble    previous stent in the left kidney  . Osteoarthritis    cervical   . Paresthesias    thumb  . Seasonal allergies      Patient Active Problem List   Diagnosis Date Noted  . Insomnia 01/09/2016  . Hypertension 09/23/2012  . Fatigue 09/23/2012  . History of tobacco abuse 09/23/2012    Past Surgical History:  Procedure Laterality Date  . ABDOMINAL HYSTERECTOMY    . DILATION AND CURETTAGE OF UTERUS  92-94   2 times between 92-94    Family History        Family Status  Relation Status  . Mother Deceased at age 33  . Father Deceased at age 17  . Sister Alive  . Brother Alive  . Sister Alive  . Brother Alive  . Brother Alive  . Daughter         Her family history includes Breast cancer in her mother; Cancer in her mother; Colon polyps in her brother, brother,  father, mother, and sister; Depression in her mother; Heart disease in her father; Hypertension in her father and mother; Prostate cancer in her father; Valvular heart disease in her daughter.    Allergies  Allergen Reactions  . Epinephrine Other (See Comments)    Per pt start shaking and mouth clicks tongue gets fat  . Formaldehyde Hives and Other (See Comments)    Made in creams     Current Meds  Medication Sig  . amitriptyline (ELAVIL) 25 MG tablet Take 1 tablet (25 mg total) by mouth at bedtime. Take 1 1/2 tablet every night  . Ascorbic Acid (VITAMIN C) 100 MG tablet Take 100 mg by mouth daily.  . cyanocobalamin 1000 MCG tablet Take 1,000 mcg by mouth daily.  . fish oil-omega-3 fatty acids 1000  MG capsule Take 1 capsule by mouth daily. Take 1200 mg/day  . Ginkgo Biloba 40 MG TABS Take by mouth.  . hydrochlorothiazide (HYDRODIURIL) 25 MG tablet Take 1 tablet daily.  . Multiple Vitamins-Minerals (WOMENS 50+ ADVANCED PO) Take 1 tablet by mouth daily.   . Probiotic Product (SOLUBLE FIBER/PROBIOTICS PO) Take by mouth.  . vitamin A 10000 UNIT capsule Take 10,000 Units by mouth daily.  . vitamin E 100 UNIT capsule Take 100 Units by mouth daily.    Patient Care Team: Margarita Rana, MD as PCP - General (Family Medicine)     Objective:   Vitals: BP 110/80 (BP Location: Right Arm, Patient Position: Sitting, Cuff Size: Large)   Pulse 80   Temp 98.3 F (36.8 C) (Oral)   Resp 16   Ht 5\' 1"  (1.549 m)   Wt 125 lb (56.7 kg)   SpO2 97%   BMI 23.62 kg/m    Physical Exam  Constitutional: She is oriented to person, place, and time. She appears well-developed and well-nourished. No distress.  HENT:  Head: Normocephalic and atraumatic.  Right Ear: Tympanic membrane, external ear and ear canal normal.  Left Ear: Tympanic membrane, external ear and ear canal normal.  Nose: Nose normal.  Mouth/Throat: Uvula is midline, oropharynx is clear and moist and mucous membranes are normal. No oropharyngeal exudate.  Eyes: Conjunctivae and EOM are normal. Pupils are equal, round, and reactive to light. Right eye exhibits no discharge. Left eye exhibits no discharge. No scleral icterus.  Neck: Normal range of motion. Neck supple. No JVD present. Carotid bruit is not present. No tracheal deviation present. No thyromegaly present.  Cardiovascular: Normal rate, regular rhythm, normal heart sounds and intact distal pulses.  Exam reveals no gallop and no friction rub.   No murmur heard. Pulmonary/Chest: Effort normal and breath sounds normal. No respiratory distress. She has no wheezes. She has no rales. She exhibits no tenderness. Right breast exhibits no inverted nipple, no mass, no nipple discharge, no skin  change and no tenderness. Left breast exhibits no inverted nipple, no mass, no nipple discharge, no skin change and no tenderness. Breasts are symmetrical.  Abdominal: Soft. Bowel sounds are normal. She exhibits no distension and no mass. There is generalized tenderness. There is no rebound and no guarding.  Generalized pressure  Musculoskeletal: Normal range of motion. She exhibits no edema or tenderness.  Lymphadenopathy:    She has no cervical adenopathy.  Neurological: She is alert and oriented to person, place, and time.  Skin: Skin is warm and dry. No rash noted. She is not diaphoretic.  Psychiatric: She has a normal mood and affect. Her behavior is normal. Judgment and thought content normal.  Vitals reviewed.    Depression Screen PHQ 2/9 Scores 07/10/2016  PHQ - 2 Score 1      Assessment & Plan:     Routine Health Maintenance and Physical Exam  Exercise Activities and Dietary recommendations Goals    None       There is no immunization history on file for this patient.  Health Maintenance  Topic Date Due  . Hepatitis C Screening  27-Dec-1950  . HIV Screening  02/20/1968  . TETANUS/TDAP  02/20/1972  . PAP SMEAR  02/19/1974  . COLONOSCOPY  02/20/2003  . MAMMOGRAM  12/13/2009  . ZOSTAVAX  02/19/2013  . INFLUENZA VACCINE  01/08/2017 (Originally 05/19/2016)      Discussed health benefits of physical activity, and encouraged her to engage in regular exercise appropriate for her age and condition.   1. Annual physical exam Normal physical exam. Recently had labs at another office for a clinical trial that she will have faxed to our office. Call if acute issue.   2. Need for influenza vaccination Flu vaccine given today without complication. Patient sat upright for 15 minutes to check for adverse reaction before being released. - Flu Vaccine QUAD 36+ mos IM  3. Need for shingles vaccine Zostavax given to patient without complications. Patient sat for 15 minutes  after administration and was tolerated well without adverse effects. - Varicella-zoster vaccine subcutaneous  4. Colitis Symptoms present for over a year after antibiotic for sinus infection and tooth abscess. Reports increasing amitriptyline and probiotic with some relief. Still has 1-2 loose, watery BM daily. Would like to have a GI involved. Has seen Dr. Allen Norris in the past and did not have a good rapport. Prefers someone different. - Ambulatory referral to Gastroenterology  5. Insomnia Stable. Diagnosis pulled for medication refill. Continue current medical treatment plan. - amitriptyline (ELAVIL) 25 MG tablet; Take 1 tablet (25 mg total) by mouth at bedtime. Take 1 1/2 tablet every night  Dispense: 135 tablet; Refill: 3  6. Essential hypertension Stable. Diagnosis pulled for medication refill. Continue current medical treatment plan. - hydrochlorothiazide (HYDRODIURIL) 25 MG tablet; Take 1 tablet daily.  Dispense: 90 tablet; Refill: 3  --------------------------------------------------------------------    Mar Daring, PA-C  [facility]

## 2016-07-10 NOTE — Patient Instructions (Signed)

## 2016-10-19 DIAGNOSIS — K529 Noninfective gastroenteritis and colitis, unspecified: Secondary | ICD-10-CM

## 2016-10-19 HISTORY — DX: Noninfective gastroenteritis and colitis, unspecified: K52.9

## 2016-10-29 ENCOUNTER — Other Ambulatory Visit
Admission: RE | Admit: 2016-10-29 | Discharge: 2016-10-29 | Disposition: A | Discharge status: Home / Self Care | Payer: Self-pay | Source: Ambulatory Visit | Attending: Gastroenterology | Admitting: Gastroenterology

## 2016-10-29 DIAGNOSIS — R197 Diarrhea, unspecified: Secondary | ICD-10-CM | POA: Insufficient documentation

## 2016-10-29 LAB — C DIFFICILE QUICK SCREEN W PCR REFLEX
C DIFFICILE (CDIFF) TOXIN: NEGATIVE
C DIFFICLE (CDIFF) ANTIGEN: NEGATIVE
C Diff interpretation: NOT DETECTED

## 2016-11-02 LAB — STOOL CULTURE: E coli, Shiga toxin Assay: NEGATIVE

## 2016-11-02 LAB — STOOL CULTURE REFLEX - CMPCXR

## 2016-11-02 LAB — STOOL CULTURE REFLEX - RSASHR

## 2016-12-17 DIAGNOSIS — H5203 Hypermetropia, bilateral: Secondary | ICD-10-CM | POA: Diagnosis not present

## 2016-12-24 ENCOUNTER — Encounter: Payer: Self-pay | Admitting: *Deleted

## 2016-12-25 ENCOUNTER — Encounter: Admission: RE | Payer: Self-pay | Source: Ambulatory Visit

## 2016-12-25 ENCOUNTER — Ambulatory Visit: Payer: BLUE CROSS/BLUE SHIELD | Admitting: Anesthesiology

## 2016-12-25 ENCOUNTER — Ambulatory Visit
Admission: RE | Admit: 2016-12-25 | Discharge: 2016-12-25 | Disposition: A | Discharge status: Home / Self Care | Payer: BLUE CROSS/BLUE SHIELD | Source: Ambulatory Visit | Attending: Gastroenterology | Admitting: Gastroenterology

## 2016-12-25 ENCOUNTER — Encounter: Payer: Self-pay | Admitting: Anesthesiology

## 2016-12-25 ENCOUNTER — Ambulatory Visit
Admission: RE | Admit: 2016-12-25 | Payer: BLUE CROSS/BLUE SHIELD | Source: Ambulatory Visit | Admitting: Gastroenterology

## 2016-12-25 ENCOUNTER — Encounter
Admission: RE | Disposition: A | Discharge status: Home / Self Care | Payer: Self-pay | Source: Ambulatory Visit | Attending: Gastroenterology

## 2016-12-25 DIAGNOSIS — F329 Major depressive disorder, single episode, unspecified: Secondary | ICD-10-CM | POA: Insufficient documentation

## 2016-12-25 DIAGNOSIS — I10 Essential (primary) hypertension: Secondary | ICD-10-CM | POA: Insufficient documentation

## 2016-12-25 DIAGNOSIS — K5790 Diverticulosis of intestine, part unspecified, without perforation or abscess without bleeding: Secondary | ICD-10-CM | POA: Diagnosis not present

## 2016-12-25 DIAGNOSIS — K5289 Other specified noninfective gastroenteritis and colitis: Secondary | ICD-10-CM | POA: Diagnosis not present

## 2016-12-25 DIAGNOSIS — K635 Polyp of colon: Secondary | ICD-10-CM | POA: Diagnosis not present

## 2016-12-25 DIAGNOSIS — D122 Benign neoplasm of ascending colon: Secondary | ICD-10-CM | POA: Diagnosis not present

## 2016-12-25 DIAGNOSIS — D125 Benign neoplasm of sigmoid colon: Secondary | ICD-10-CM | POA: Insufficient documentation

## 2016-12-25 DIAGNOSIS — M479 Spondylosis, unspecified: Secondary | ICD-10-CM | POA: Diagnosis not present

## 2016-12-25 DIAGNOSIS — Z1211 Encounter for screening for malignant neoplasm of colon: Secondary | ICD-10-CM | POA: Insufficient documentation

## 2016-12-25 DIAGNOSIS — Z87891 Personal history of nicotine dependence: Secondary | ICD-10-CM | POA: Diagnosis not present

## 2016-12-25 DIAGNOSIS — Z79899 Other long term (current) drug therapy: Secondary | ICD-10-CM | POA: Insufficient documentation

## 2016-12-25 DIAGNOSIS — K529 Noninfective gastroenteritis and colitis, unspecified: Secondary | ICD-10-CM | POA: Diagnosis not present

## 2016-12-25 DIAGNOSIS — K5730 Diverticulosis of large intestine without perforation or abscess without bleeding: Secondary | ICD-10-CM | POA: Diagnosis not present

## 2016-12-25 HISTORY — PX: COLONOSCOPY WITH PROPOFOL: SHX5780

## 2016-12-25 LAB — SURGICAL PATHOLOGY

## 2016-12-25 LAB — HM COLONOSCOPY

## 2016-12-25 SURGERY — COLONOSCOPY WITH PROPOFOL
Anesthesia: General

## 2016-12-25 MED ORDER — FENTANYL CITRATE (PF) 100 MCG/2ML IJ SOLN
INTRAMUSCULAR | Status: DC | PRN
  Administered 2016-12-25 (×2): 50 ug via INTRAVENOUS

## 2016-12-25 MED ORDER — PROPOFOL 10 MG/ML IV BOLUS
INTRAVENOUS | Status: DC | PRN
  Administered 2016-12-25: 50 mg via INTRAVENOUS

## 2016-12-25 MED ORDER — PROPOFOL 500 MG/50ML IV EMUL
INTRAVENOUS | Status: DC | PRN
  Administered 2016-12-25: 125 ug/kg/min via INTRAVENOUS

## 2016-12-25 MED ORDER — SODIUM CHLORIDE 0.9 % IV SOLN
INTRAVENOUS | Status: DC
  Administered 2016-12-25: 11:00:00 via INTRAVENOUS
  Administered 2016-12-25: 1000 mL via INTRAVENOUS

## 2016-12-25 MED ORDER — DEXMEDETOMIDINE HCL 200 MCG/2ML IV SOLN
INTRAVENOUS | Status: DC | PRN
  Administered 2016-12-25: 8 ug via INTRAVENOUS

## 2016-12-25 MED ORDER — SODIUM CHLORIDE 0.9 % IV SOLN: INTRAVENOUS | Status: DC

## 2016-12-25 NOTE — Op Note (Addendum)
[facility] Gastroenterology Patient Name: Kelly Norris Procedure Date: 12/25/2016 10:16 AM MRN: [id number] Account #: [id number] Date of Birth: 1951/06/30 Admit Type: Outpatient Age: 66 Room: [facility] ENDO ROOM 1 Gender: Female Note Status: Finalized Procedure:            Colonoscopy Indications:          Screening for colorectal malignant neoplasm Providers:            Lollie Sails, MD Referring MD:         Mar Daring (Referring MD) Medicines:            Monitored Anesthesia Care Complications:        No immediate complications. Procedure:            Pre-Anesthesia Assessment:                       - ASA Grade Assessment: II - A patient with mild                        systemic disease.                       After obtaining informed consent, the colonoscope was                        passed under direct vision. Throughout the procedure,                        the patient's blood pressure, pulse, and oxygen                        saturations were monitored continuously. The Olympus                        PCF-H180AL colonoscope ( S#: [id number] ) was introduced                        through the anus and advanced to the the terminal                        ileum. The patient tolerated the procedure well. The                        quality of the bowel preparation was fair except the                        ascending colon was poor. Findings:      Multiple small and large-mouthed diverticula were found in the sigmoid       colon and descending colon.      A 4 mm polyp was found in the distal sigmoid colon. The polyp was       sessile. The polyp was removed with a cold snare. Resection and       retrieval were complete.      A 5 mm polyp was found in the ascending colon. The polyp was flat. The       polyp was removed with a cold snare. Resection and retrieval were       complete.      Localized moderate inflammation characterized by congestion  (edema),       erythema  and friability was found peri- appendiceal orifice. Biopsies       were taken with a cold forceps for histology.      A 1 mm polyp was found in the proximal sigmoid colon. The polyp was       sessile. The polyp was removed with a cold biopsy forceps. Resection and       retrieval were complete.      Biopsies for histology were taken with a cold forceps from the right       colon and left colon for evaluation of microscopic colitis.      The digital rectal exam was normal.      The terminal ileum appeared normal. Impression:           - Diverticulosis in the sigmoid colon and in the                        descending colon.                       - One 4 mm polyp in the distal sigmoid colon, removed                        with a cold snare. Resected and retrieved.                       - One 5 mm polyp in the ascending colon, removed with a                        cold snare. Resected and retrieved.                       - Localized moderate inflammation was found at the                        appendiceal orifice secondary to colitis and consistent                        with inflammatory bowel disease. Biopsied.                       - One 1 mm polyp in the proximal sigmoid colon, removed                        with a cold biopsy forceps. Resected and retrieved.                       - Biopsies were taken with a cold forceps from the                        right colon and left colon for evaluation of                        microscopic colitis. Recommendation:       - Discharge patient to home.                       - Await pathology results.                       - Check [employer] IBD panel today. Procedure Code(s):    ---  Professional ---                       [phone], Colonoscopy, flexible; with removal of tumor(s),                        polyp(s), or other lesion(s) by snare technique                       45380, 59, Colonoscopy, flexible; with biopsy, single                         or multiple CPT copyright 2016 American Medical Association. All rights reserved. The codes documented in this report are preliminary and upon coder review may  be revised to meet current compliance requirements. Lollie Sails, MD 12/25/2016 11:39:01 AM This report has been signed electronically. Number of Addenda: 0 Note Initiated On: 12/25/2016 10:16 AM Scope Withdrawal Time: 0 hours 17 minutes 30 seconds  Total Procedure Duration: 0 hours 43 minutes 55 seconds       [facility]

## 2016-12-25 NOTE — Anesthesia Postprocedure Evaluation (Signed)
Anesthesia Post Note  [name]  Procedure(s) Performed: Procedure(s) (LRB): COLONOSCOPY WITH PROPOFOL (N/A)  Patient location during evaluation: PACU Anesthesia Type: General Level of consciousness: awake and alert and oriented Pain management: pain level controlled Vital Signs Assessment: post-procedure vital signs reviewed and stable Respiratory status: spontaneous breathing Cardiovascular status: blood pressure returned to baseline Anesthetic complications: no     Last Vitals:  Vitals:   12/25/16 1156 12/25/16 1206  BP: 130/84 (!) 131/109  Pulse: 79 85  Resp: 11 20  Temp:      Last Pain:  Vitals:   12/25/16 1136  TempSrc: Tympanic                 [name]

## 2016-12-25 NOTE — Transfer of Care (Signed)
Immediate Anesthesia Transfer of Care Note  [name]  Procedure(s) Performed: Procedure(s): COLONOSCOPY WITH PROPOFOL (N/A)  Patient Location: PACU and Endoscopy Unit  Anesthesia Type:General  Level of Consciousness: awake, alert  and oriented  Airway & Oxygen Therapy: Patient Spontanous Breathing  Post-op Assessment: Report given to RN and Post -op Vital signs reviewed and stable  Post vital signs: Reviewed and stable  Last Vitals:  Vitals:   12/25/16 1001  BP: 135/81  Pulse: 89  Resp: 16  Temp: (!) 35.8 C    Last Pain:  Vitals:   12/25/16 1001  TempSrc: Tympanic         Complications: No apparent anesthesia complications

## 2016-12-25 NOTE — Anesthesia Preprocedure Evaluation (Addendum)
Anesthesia Evaluation  Patient identified by MRN, date of birth, ID band Patient awake    Reviewed: Allergy & Precautions, NPO status , Patient's Chart, lab work & pertinent test results  Airway Mallampati: III  TM Distance: <3 FB     Dental  (+) Caps   Pulmonary neg pulmonary ROS, former smoker,    Pulmonary exam normal        Cardiovascular hypertension, Pt. on medications Normal cardiovascular exam     Neuro/Psych PSYCHIATRIC DISORDERS Depression negative neurological ROS     GI/Hepatic negative GI ROS, Neg liver ROS,   Endo/Other  negative endocrine ROS  Renal/GU Renal disease  Female GU complaint     Musculoskeletal  (+) Arthritis , Osteoarthritis,    Abdominal Normal abdominal exam  (+)   Peds negative pediatric ROS (+)  Hematology negative hematology ROS (+)   Anesthesia Other Findings Past Medical History: No date: Allergy No date: Depression 1994: Endometriosis No date: H/O breast biopsy     Comment: x 3 No date: H/O hydronephrosis     Comment: left  No date: Hypertension     Comment: 2004 No date: Kidney trouble     Comment: previous stent in the left kidney No date: Osteoarthritis     Comment: cervical  No date: Paresthesias     Comment: thumb No date: Seasonal allergies  Reproductive/Obstetrics                            Anesthesia Physical Anesthesia Plan  ASA: II  Anesthesia Plan: General   Post-op Pain Management:    Induction: Intravenous  Airway Management Planned: Nasal Cannula  Additional Equipment:   Intra-op Plan:   Post-operative Plan:   Informed Consent: I have reviewed the patients History and Physical, chart, labs and discussed the procedure including the risks, benefits and alternatives for the proposed anesthesia with the patient or authorized representative who has indicated his/her understanding and acceptance.   Dental advisory  given  Plan Discussed with: CRNA and Surgeon  Anesthesia Plan Comments:         Anesthesia Quick Evaluation

## 2016-12-25 NOTE — H&P (Addendum)
Outpatient short stay form Pre-procedure 12/25/2016 10:33 AM Kelly Sails MD  Primary Physician: Lillia Abed PA  Reason for visit:  Colonoscopy  History of present illness:  Patient is a 66 year old female presenting today as above. Several months ago she had a episode of possible antibiotic associated diarrhea that is negative for C. difficile. Her bowel habits seem to be variable but improved in the past couple of months. She sees no blood in the stool. He has had a cold are test that was negative. There is concern for colitis. Patient tolerated her prep well. She takes no aspirin or blood thinning agents. It is of note patient characterizes herself as a "free bleeder" however has been told that she has no abnormality for this.   Current Facility-Administered Medications:  .  0.9 %  sodium chloride infusion, , Intravenous, Continuous, Kelly Sails, MD, Last Rate: 20 mL/hr at 12/25/16 1021, 1,000 mL at 12/25/16 1021 .  0.9 %  sodium chloride infusion, , Intravenous, Continuous, Kelly Sails, MD  Prescriptions Prior to Admission  Medication Sig Dispense Refill Last Dose  . amitriptyline (ELAVIL) 25 MG tablet Take 1 tablet (25 mg total) by mouth at bedtime. Take 1 1/2 tablet every night 135 tablet 3 Past Week at Unknown time  . Ascorbic Acid (VITAMIN C) 100 MG tablet Take 100 mg by mouth daily.   Past Week at Unknown time  . cyanocobalamin 1000 MCG tablet Take 1,000 mcg by mouth daily.   Past Week at Unknown time  . fish oil-omega-3 fatty acids 1000 MG capsule Take 1 capsule by mouth daily. Take 1200 mg/day   Past Week at Unknown time  . Ginkgo Biloba 40 MG TABS Take by mouth.   Past Week at Unknown time  . hydrochlorothiazide (HYDRODIURIL) 25 MG tablet Take 1 tablet daily. 90 tablet 3 12/24/2016 at 0700  . Multiple Vitamins-Minerals (WOMENS 50+ ADVANCED PO) Take 1 tablet by mouth daily.    Past Week at Unknown time  . Probiotic Product (SOLUBLE FIBER/PROBIOTICS PO) Take by  mouth.   Past Week at Unknown time  . vitamin A 10000 UNIT capsule Take 10,000 Units by mouth daily.   Past Week at Unknown time  . vitamin E 100 UNIT capsule Take 100 Units by mouth daily.   Past Week at Unknown time     Allergies  Allergen Reactions  . Epinephrine Other (See Comments)    Per pt start shaking and mouth clicks tongue gets fat  . Formaldehyde Hives and Other (See Comments)    Made in creams   . Metrogel [Metronidazole] Other (See Comments)     Past Medical History:  Diagnosis Date  . Allergy   . Depression   . Endometriosis 1994  . H/O breast biopsy    x 3  . H/O hydronephrosis    left   . Hypertension    2004  . Kidney trouble    previous stent in the left kidney  . Osteoarthritis    cervical   . Paresthesias    thumb  . Seasonal allergies     Review of systems:      Physical Exam    Heart and lungs: Regular rate and rhythm without rub or gallop, lungs are bilaterally clear.    HEENT: Normocephalic atraumatic eyes are anicteric    Other:     Pertinant exam for procedure: Soft nontender nondistended bowel sounds positive normoactive.    Planned proceedures: Colonoscopy and indicated procedures. I have  discussed the risks benefits and complications of procedures to include not limited to bleeding, infection, perforation and the risk of sedation and the patient wishes to proceed.    Kelly Sails, MD Gastroenterology 12/25/2016  10:33 AM

## 2016-12-25 NOTE — Anesthesia Post-op Follow-up Note (Cosign Needed)
Anesthesia QCDR form completed.        

## 2016-12-28 ENCOUNTER — Encounter: Payer: Self-pay | Admitting: Gastroenterology

## 2017-01-05 ENCOUNTER — Encounter: Payer: Self-pay | Admitting: Family Medicine

## 2017-01-06 LAB — MISCELLANEOUS TEST: MISCELLANEOUS TEST: 1800

## 2017-01-27 DIAGNOSIS — R197 Diarrhea, unspecified: Secondary | ICD-10-CM | POA: Diagnosis not present

## 2017-01-27 DIAGNOSIS — Z86010 Personal history of colonic polyps: Secondary | ICD-10-CM | POA: Diagnosis not present

## 2017-02-09 ENCOUNTER — Other Ambulatory Visit: Payer: Self-pay | Admitting: Physician Assistant

## 2017-02-09 DIAGNOSIS — G4700 Insomnia, unspecified: Secondary | ICD-10-CM

## 2017-02-09 MED ORDER — AMITRIPTYLINE HCL 25 MG PO TABS: 25.0000 mg | ORAL_TABLET | Freq: Every day | ORAL | 3 refills | Status: DC

## 2017-02-09 NOTE — Telephone Encounter (Signed)
[facility] faxed a request on the following medication. Thanks CC  amitriptyline (ELAVIL) 25 MG tablet  Take 1 1/2 tablets by mouth every night at bedtime.

## 2017-02-24 ENCOUNTER — Telehealth: Payer: Self-pay | Admitting: Family Medicine

## 2017-02-24 DIAGNOSIS — I10 Essential (primary) hypertension: Secondary | ICD-10-CM

## 2017-02-24 MED ORDER — HYDROCHLOROTHIAZIDE 25 MG PO TABS: ORAL_TABLET | ORAL | 3 refills | Status: DC

## 2017-02-24 NOTE — Telephone Encounter (Signed)
[facility] faxed a request on the following medication. Thanks CC  hydrochlorothiazide (HYDRODIURIL) 25 MG tablet  Take 1 tablet by mouth daily.

## 2017-02-24 NOTE — Telephone Encounter (Signed)
HCTZ refilled. 

## 2017-02-24 NOTE — Telephone Encounter (Signed)
After reviewing chart last HTN follow up was 07/10/06 and medication was filled that day with qty 90, 3Rf. Patient should be running out of medication by end of month if I calculated correctly. Please review chart and advise. KW

## 2017-03-02 ENCOUNTER — Other Ambulatory Visit: Payer: Self-pay

## 2017-03-02 DIAGNOSIS — I10 Essential (primary) hypertension: Secondary | ICD-10-CM

## 2017-03-02 MED ORDER — HYDROCHLOROTHIAZIDE 25 MG PO TABS: ORAL_TABLET | ORAL | 3 refills | Status: DC

## 2017-03-02 NOTE — Telephone Encounter (Signed)
Patient calling that she doesn't have no more Blood Pressure pills and that the pharmacist told her hat they cannot fill her prescription because they have tried to call us several times and we have not return the calls. Which I checked the notes and there is no telephone encounter notes about it. There's a note on the 05/9 that a faxed came from the pharmacy and looks like you filled the medication on 05/09 but it was print. Not sure if it was called in. Re-ordered medication.  Thanks,  -Marialena Wollen

## 2018-01-24 ENCOUNTER — Encounter: Payer: Self-pay | Admitting: Family Medicine

## 2018-01-24 ENCOUNTER — Ambulatory Visit (INDEPENDENT_AMBULATORY_CARE_PROVIDER_SITE_OTHER): Payer: BLUE CROSS/BLUE SHIELD | Admitting: Family Medicine

## 2018-01-24 VITALS — BP 120/82 | HR 81 | Temp 98.1°F | Resp 16 | Ht 61.0 in | Wt 126.8 lb

## 2018-01-24 DIAGNOSIS — Z114 Encounter for screening for human immunodeficiency virus [HIV]: Secondary | ICD-10-CM

## 2018-01-24 DIAGNOSIS — Z1159 Encounter for screening for other viral diseases: Secondary | ICD-10-CM

## 2018-01-24 DIAGNOSIS — Z1231 Encounter for screening mammogram for malignant neoplasm of breast: Secondary | ICD-10-CM

## 2018-01-24 DIAGNOSIS — Z0000 Encounter for general adult medical examination without abnormal findings: Secondary | ICD-10-CM

## 2018-01-24 DIAGNOSIS — Z1239 Encounter for other screening for malignant neoplasm of breast: Secondary | ICD-10-CM

## 2018-01-24 DIAGNOSIS — Z780 Asymptomatic menopausal state: Secondary | ICD-10-CM

## 2018-01-24 DIAGNOSIS — I10 Essential (primary) hypertension: Secondary | ICD-10-CM

## 2018-01-24 DIAGNOSIS — M8580 Other specified disorders of bone density and structure, unspecified site: Secondary | ICD-10-CM

## 2018-01-24 NOTE — Assessment & Plan Note (Signed)
Well-controlled Continue HCTZ 25 mg daily Check CMP

## 2018-01-24 NOTE — Progress Notes (Signed)
[name], Female    DOB: 05/01/1951, 67 y.o.   MRN: [id number] Visit Date: 01/24/2018  Today's Provider: Lavon Paganini, MD   Chief Complaint  Patient presents with  . Annual Exam   Subjective:    Annual physical exam Kelly Norris is a 67 y.o. female who presents today for health maintenance and complete physical. She feels well. She reports exercising. She gets about 13,000 steps per day. She reports she is sleeping fairly well. She wakes up once every night around 3:00 am, and falls back asleep easily, but does not fell rested.  Last colonoscopy- 12/25/2016- positive for colitis, with tubular adenomas.  Repeat in 5 yrs Last BMD- 02/21/2013- osteopenia Last mammogram- 12/20/2007- BI-RADS 1  HTN: - Medications: HCTZ - Compliance: good - Checking BP at home: no - Denies any SOB, CP, vision changes, LE edema, medication SEs, or symptoms of hypotension -----------------------------------------------------------------   Review of Systems  Constitutional: Positive for unexpected weight change. Negative for activity change, appetite change, chills, diaphoresis, fatigue and fever.  HENT: Negative.   Eyes: Negative.   Respiratory: Positive for cough. Negative for apnea, choking, chest tightness, shortness of breath, wheezing and stridor.   Cardiovascular: Negative.   Gastrointestinal: Positive for abdominal distention. Negative for abdominal pain, anal bleeding, blood in stool, constipation, diarrhea, nausea, rectal pain and vomiting.  Endocrine: Negative.   Genitourinary: Negative.   Musculoskeletal: Negative.   Skin: Negative.   Allergic/Immunologic: Negative.   Neurological: Negative.   Hematological: Negative.   Psychiatric/Behavioral: Negative.     Social History      She  reports that she quit smoking about 4 years ago. Her smoking use included cigarettes. She quit after 20.00 years of use. She has never used smokeless tobacco. She  reports that she does not drink alcohol or use drugs.       Social History   Socioeconomic History  . Marital status: Divorced    Spouse name: Not on file  . Number of children: 2  . Years of education: 16  . Highest education level: Bachelor's degree (e.g., BA, AB, BS)  Occupational History    Employer: [employer]  Social Needs  . Financial resource strain: Not hard at all  . Food insecurity:    Worry: Never true    Inability: Never true  . Transportation needs:    Medical: No    Non-medical: No  Tobacco Use  . Smoking status: Former Smoker    Years: 20.00    Types: Cigarettes    Last attempt to quit: 10/18/2013    Years since quitting: 4.2  . Smokeless tobacco: Never Used  . Tobacco comment: was smoking about 5 cigarettes daily  Substance and Sexual Activity  . Alcohol use: No  . Drug use: No  . Sexual activity: Not on file  Lifestyle  . Physical activity:    Days per week: 0 days    Minutes per session: 0 min  . Stress: Not on file  Relationships  . Social connections:    Talks on phone: Not on file    Gets together: Not on file    Attends religious service: Not on file    Active member of club or organization: Not on file    Attends meetings of clubs or organizations: Not on file    Relationship status: Not on file  Other Topics Concern  . Not on file  Social History Narrative  Past Medical History:  Diagnosis Date  . Allergy   . Depression   . Endometriosis 1994  . H/O breast biopsy    x 3  . H/O hydronephrosis    left   . Hypertension    2004  . Kidney trouble    previous stent in the left kidney  . Osteoarthritis    cervical   . Paresthesias    thumb  . Seasonal allergies      Patient Active Problem List   Diagnosis Date Noted  . Insomnia 01/09/2016  . Hypertension 09/23/2012  . Fatigue 09/23/2012  . History of tobacco abuse 09/23/2012    Past Surgical History:  Procedure Laterality Date  . ABDOMINAL HYSTERECTOMY  1994    Partial (does not have cervix); endometriosis  . COLONOSCOPY WITH PROPOFOL N/A 12/25/2016   Procedure: COLONOSCOPY WITH PROPOFOL;  Surgeon: Lollie Sails, MD;  [facility] ENDOSCOPY;  Service: Endoscopy;  Laterality: N/A;  . DILATION AND CURETTAGE OF UTERUS  92-94   2 times between 92-94    Family History        Family Status  Relation Name Status  . Mother  Deceased at age 64  . Father  Deceased at age 56  . Sister  Alive  . Brother  Alive  . Sister  Alive  . Brother  Alive  . Brother  Alive  . Daughter  (Not Specified)        Her family history includes Breast cancer in her mother; Cancer in her mother; Colitis in her brother, brother, father, mother, and sister; Colon polyps in her brother, brother, father, mother, and sister; Depression in her mother; Heart disease in her father; Hypertension in her father and mother; Prostate cancer in her father; Valvular heart disease in her daughter.      Allergies  Allergen Reactions  . Epinephrine Other (See Comments)    Per pt start shaking and mouth clicks tongue gets fat  . Formaldehyde Hives and Other (See Comments)    Made in creams   . Metrogel [Metronidazole] Other (See Comments)     Current Outpatient Medications:  .  Ascorbic Acid (VITAMIN C) 100 MG tablet, Take 100 mg by mouth daily., Disp: , Rfl:  .  cyanocobalamin 1000 MCG tablet, Take 1,000 mcg by mouth daily., Disp: , Rfl:  .  fish oil-omega-3 fatty acids 1000 MG capsule, Take 1 capsule by mouth daily. Take 1200 mg/day, Disp: , Rfl:  .  Ginkgo Biloba 40 MG TABS, Take by mouth., Disp: , Rfl:  .  hydrochlorothiazide (HYDRODIURIL) 25 MG tablet, Take 1 tablet daily., Disp: 90 tablet, Rfl: 3 .  Multiple Vitamins-Minerals (WOMENS 50+ ADVANCED PO), Take 1 tablet by mouth daily. , Disp: , Rfl:  .  Probiotic Product (SOLUBLE FIBER/PROBIOTICS PO), Take by mouth., Disp: , Rfl:    Patient Care Team: Rubye Beach as PCP - General (Family Medicine)       Objective:   Vitals: BP 120/82 (BP Location: Left Arm, Patient Position: Sitting, Cuff Size: Normal)   Pulse 81   Temp 98.1 F (36.7 C) (Oral)   Resp 16   Ht 5\' 1"  (1.549 m)   Wt 126 lb 12.8 oz (57.5 kg)   SpO2 96%   BMI 23.96 kg/m    Vitals:   01/24/18 1502  BP: 120/82  Pulse: 81  Resp: 16  Temp: 98.1 F (36.7 C)  TempSrc: Oral  SpO2: 96%  Weight: 126 lb 12.8 oz (57.5  kg)  Height: 5\' 1"  (1.549 m)     Physical Exam  Constitutional: She is oriented to person, place, and time. She appears well-developed and well-nourished. No distress.  HENT:  Head: Normocephalic and atraumatic.  Right Ear: External ear normal.  Left Ear: External ear normal.  Nose: Nose normal.  Mouth/Throat: Oropharynx is clear and moist.  Eyes: Pupils are equal, round, and reactive to light. Conjunctivae and EOM are normal. No scleral icterus.  Neck: Neck supple. No thyromegaly present.  Cardiovascular: Normal rate, regular rhythm, normal heart sounds and intact distal pulses.  No murmur heard. Pulmonary/Chest: Breath sounds normal. No respiratory distress. She has no wheezes. She has no rales.  Abdominal: Soft. Bowel sounds are normal. She exhibits no distension. There is no tenderness. There is no rebound and no guarding.  Genitourinary:  Genitourinary Comments: Breasts: breasts appear normal, no suspicious masses, no skin or nipple changes or axillary nodes.  Musculoskeletal: She exhibits no edema or deformity.  Lymphadenopathy:    She has no cervical adenopathy.  Neurological: She is alert and oriented to person, place, and time.  Skin: Skin is warm and dry. No rash noted.  Psychiatric: She has a normal mood and affect. Her behavior is normal.  Vitals reviewed.    Depression Screen PHQ 2/9 Scores 01/24/2018 07/10/2016  PHQ - 2 Score 1 1     Assessment & Plan:     Routine Health Maintenance and Physical Exam  Exercise Activities and Dietary recommendations Goals    None       Immunization History  Administered Date(s) Administered  . Influenza,inj,Quad PF,6+ Mos 07/10/2016  . Zoster 07/10/2016    Health Maintenance  Topic Date Due  . Hepatitis C Screening  [birth year]  . HIV Screening  02/20/1968  . TETANUS/TDAP  02/20/1972  . PAP SMEAR  02/19/1974  . MAMMOGRAM  12/13/2009  . INFLUENZA VACCINE  05/19/2018  . COLONOSCOPY  12/26/2026     Discussed health benefits of physical activity, and encouraged her to engage in regular exercise appropriate for her age and condition.    -------------------------------------------------------------------- Problem List Items Addressed This Visit      Cardiovascular and Mediastinum   Hypertension    Well-controlled Continue HCTZ 25 mg daily Check CMP       Other Visit Diagnoses    Encounter for annual physical exam    -  Primary   Relevant Orders   CBC w/Diff/Platelet   Comprehensive metabolic panel   Lipid panel   Postmenopausal       Relevant Orders   DG Bone Density   Screening for breast cancer       Relevant Orders   MS DIGITAL SCREENING TOMO BILATERAL   Osteopenia, unspecified location       Relevant Orders   DG Bone Density   Screening for HIV (human immunodeficiency virus)       Relevant Orders   HIV antibody (with reflex)   Need for hepatitis C screening test       Relevant Orders   Hepatitis C Antibody       Return in about 1 year (around 01/25/2019) for physical. Patient has follow-up visit in 1 year after her 67th birthday for update of TDAP and Prevnar vaccines.  Will add to [facility] waitlist   The entirety of the information documented in the History of Present Illness, Review of Systems and Physical Exam were personally obtained by me. Portions of this information were initially documented by Martha Clan, CMA  and reviewed by me for thoroughness and accuracy.    [name], MD, MPH [facility] 01/24/2018 3:55 PM

## 2018-01-24 NOTE — Patient Instructions (Signed)
Preventive Care 40-64 Years, Female Preventive care refers to lifestyle choices and visits with your health care provider that can promote health and wellness. What does preventive care include?  A yearly physical exam. This is also called an annual well check.  Dental exams once or twice a year.  Routine eye exams. Ask your health care provider how often you should have your eyes checked.  Personal lifestyle choices, including: ? Daily care of your teeth and gums. ? Regular physical activity. ? Eating a healthy diet. ? Avoiding tobacco and drug use. ? Limiting alcohol use. ? Practicing safe sex. ? Taking low-dose aspirin daily starting at age 58. ? Taking vitamin and mineral supplements as recommended by your health care provider. What happens during an annual well check? The services and screenings done by your health care provider during your annual well check will depend on your age, overall health, lifestyle risk factors, and family history of disease. Counseling Your health care provider may ask you questions about your:  Alcohol use.  Tobacco use.  Drug use.  Emotional well-being.  Home and relationship well-being.  Sexual activity.  Eating habits.  Work and work Statistician.  Method of birth control.  Menstrual cycle.  Pregnancy history.  Screening You may have the following tests or measurements:  Height, weight, and BMI.  Blood pressure.  Lipid and cholesterol levels. These may be checked every 5 years, or more frequently if you are over 81 years old.  Skin check.  Lung cancer screening. You may have this screening every year starting at age 78 if you have a 30-pack-year history of smoking and currently smoke or have quit within the past 15 years.  Fecal occult blood test (FOBT) of the stool. You may have this test every year starting at age 65.  Flexible sigmoidoscopy or colonoscopy. You may have a sigmoidoscopy every 5 years or a colonoscopy  every 10 years starting at age 30.  Hepatitis C blood test.  Hepatitis B blood test.  Sexually transmitted disease (STD) testing.  Diabetes screening. This is done by checking your blood sugar (glucose) after you have not eaten for a while (fasting). You may have this done every 1-3 years.  Mammogram. This may be done every 1-2 years. Talk to your health care provider about when you should start having regular mammograms. This may depend on whether you have a family history of breast cancer.  BRCA-related cancer screening. This may be done if you have a family history of breast, ovarian, tubal, or peritoneal cancers.  Pelvic exam and Pap test. This may be done every 3 years starting at age 80. Starting at age 36, this may be done every 5 years if you have a Pap test in combination with an HPV test.  Bone density scan. This is done to screen for osteoporosis. You may have this scan if you are at high risk for osteoporosis.  Discuss your test results, treatment options, and if necessary, the need for more tests with your health care provider. Vaccines Your health care provider may recommend certain vaccines, such as:  Influenza vaccine. This is recommended every year.  Tetanus, diphtheria, and acellular pertussis (Tdap, Td) vaccine. You may need a Td booster every 10 years.  Varicella vaccine. You may need this if you have not been vaccinated.  Zoster vaccine. You may need this after age 5.  Measles, mumps, and rubella (MMR) vaccine. You may need at least one dose of MMR if you were born in  1957 or later. You may also need a second dose.  Pneumococcal 13-valent conjugate (PCV13) vaccine. You may need this if you have certain conditions and were not previously vaccinated.  Pneumococcal polysaccharide (PPSV23) vaccine. You may need one or two doses if you smoke cigarettes or if you have certain conditions.  Meningococcal vaccine. You may need this if you have certain  conditions.  Hepatitis A vaccine. You may need this if you have certain conditions or if you travel or work in places where you may be exposed to hepatitis A.  Hepatitis B vaccine. You may need this if you have certain conditions or if you travel or work in places where you may be exposed to hepatitis B.  Haemophilus influenzae type b (Hib) vaccine. You may need this if you have certain conditions.  Talk to your health care provider about which screenings and vaccines you need and how often you need them. This information is not intended to replace advice given to you by your health care provider. Make sure you discuss any questions you have with your health care provider. Document Released: 11/01/2015 Document Revised: 06/24/2016 Document Reviewed: 08/06/2015 Elsevier Interactive Patient Education  2018 Elsevier Inc.  

## 2018-02-18 ENCOUNTER — Other Ambulatory Visit: Payer: Self-pay | Admitting: Physician Assistant

## 2018-02-18 ENCOUNTER — Telehealth: Payer: Self-pay | Admitting: Physician Assistant

## 2018-02-18 DIAGNOSIS — Z1231 Encounter for screening mammogram for malignant neoplasm of breast: Secondary | ICD-10-CM

## 2018-02-18 DIAGNOSIS — I10 Essential (primary) hypertension: Secondary | ICD-10-CM

## 2018-02-18 NOTE — Telephone Encounter (Signed)
Ordered

## 2018-02-18 NOTE — Telephone Encounter (Signed)
Per [place] [facility] pt will need an order for mammogram [id number].The order that was entered is for bcep program

## 2018-02-18 NOTE — Telephone Encounter (Signed)
Patient needs CPE appt before more refills. Not seen since 2017.

## 2018-02-18 NOTE — Addendum Note (Signed)
Addended by: Shawna Orleans on: 02/18/2018 11:32 AM   Modules accepted: Orders

## 2018-03-02 ENCOUNTER — Ambulatory Visit (INDEPENDENT_AMBULATORY_CARE_PROVIDER_SITE_OTHER): Payer: BLUE CROSS/BLUE SHIELD | Admitting: Family Medicine

## 2018-03-02 DIAGNOSIS — Z23 Encounter for immunization: Secondary | ICD-10-CM | POA: Diagnosis not present

## 2018-03-02 NOTE — Progress Notes (Signed)
Pt presents for vaccines only.

## 2018-03-23 ENCOUNTER — Other Ambulatory Visit: Payer: Self-pay | Admitting: Physician Assistant

## 2018-03-23 DIAGNOSIS — I10 Essential (primary) hypertension: Secondary | ICD-10-CM

## 2018-03-24 NOTE — Telephone Encounter (Signed)
Pt contacted office for refill request on the following medications:  hydrochlorothiazide (HYDRODIURIL) 25 MG tablet  90 day supply  [facility] [address]  I have updated pt's PCP to Dr. Brita Romp LOV: 01/24/18 with Dr. B Last Rx: 02/18/18 30 day supply Please advise. Thanks TNP

## 2018-03-28 ENCOUNTER — Telehealth: Payer: Self-pay

## 2018-03-28 DIAGNOSIS — K529 Noninfective gastroenteritis and colitis, unspecified: Secondary | ICD-10-CM

## 2018-03-28 NOTE — Telephone Encounter (Signed)
Fax states- Caller states, colitis issues, was sent to Dr. Vira Agar who "did not help her". Givena colonoscopy and told to FU in 5 years. Requesting 2nd opinion from [place]. Referral ordered per verbal order from Dr. Jacinto Reap.

## 2018-03-30 DIAGNOSIS — Z8639 Personal history of other endocrine, nutritional and metabolic disease: Secondary | ICD-10-CM | POA: Diagnosis not present

## 2018-03-30 DIAGNOSIS — R141 Gas pain: Secondary | ICD-10-CM | POA: Diagnosis not present

## 2018-03-30 DIAGNOSIS — R197 Diarrhea, unspecified: Secondary | ICD-10-CM | POA: Diagnosis not present

## 2018-03-30 DIAGNOSIS — R635 Abnormal weight gain: Secondary | ICD-10-CM | POA: Diagnosis not present

## 2018-04-06 ENCOUNTER — Telehealth: Payer: Self-pay | Admitting: Family Medicine

## 2018-04-06 NOTE — Telephone Encounter (Signed)
Noted.  [name], MD, MPH [facility] 04/06/2018 9:59 AM

## 2018-04-06 NOTE — Telephone Encounter (Signed)
FYI--Pt does not want bone density set up at present time.I offered to schedule her maybe a few months out but pt refused.She will call when ready to schedule

## 2018-04-06 NOTE — Telephone Encounter (Signed)
Please review

## 2018-04-14 ENCOUNTER — Other Ambulatory Visit: Payer: Self-pay | Admitting: Gastroenterology

## 2018-04-14 ENCOUNTER — Other Ambulatory Visit
Admission: RE | Admit: 2018-04-14 | Discharge: 2018-04-14 | Disposition: A | Discharge status: Home / Self Care | Payer: BLUE CROSS/BLUE SHIELD | Source: Ambulatory Visit | Attending: Gastroenterology | Admitting: Gastroenterology

## 2018-04-14 DIAGNOSIS — R140 Abdominal distension (gaseous): Secondary | ICD-10-CM | POA: Diagnosis not present

## 2018-04-14 DIAGNOSIS — R197 Diarrhea, unspecified: Secondary | ICD-10-CM

## 2018-04-14 DIAGNOSIS — K529 Noninfective gastroenteritis and colitis, unspecified: Secondary | ICD-10-CM | POA: Diagnosis not present

## 2018-04-14 DIAGNOSIS — R110 Nausea: Secondary | ICD-10-CM | POA: Diagnosis not present

## 2018-04-14 DIAGNOSIS — R112 Nausea with vomiting, unspecified: Secondary | ICD-10-CM | POA: Diagnosis not present

## 2018-04-14 LAB — GASTROINTESTINAL PANEL BY PCR, STOOL (REPLACES STOOL CULTURE)
Adenovirus F40/41: NOT DETECTED
Astrovirus: NOT DETECTED
Campylobacter species: NOT DETECTED
Cryptosporidium: NOT DETECTED
Cyclospora cayetanensis: NOT DETECTED
ENTAMOEBA HISTOLYTICA: NOT DETECTED
ENTEROAGGREGATIVE E COLI (EAEC): NOT DETECTED
ENTEROPATHOGENIC E COLI (EPEC): NOT DETECTED
Enterotoxigenic E coli (ETEC): NOT DETECTED
GIARDIA LAMBLIA: NOT DETECTED
Norovirus GI/GII: NOT DETECTED
Plesimonas shigelloides: NOT DETECTED
Rotavirus A: NOT DETECTED
SAPOVIRUS (I, II, IV, AND V): NOT DETECTED
SHIGA LIKE TOXIN PRODUCING E COLI (STEC): NOT DETECTED
Salmonella species: NOT DETECTED
Shigella/Enteroinvasive E coli (EIEC): NOT DETECTED
VIBRIO CHOLERAE: NOT DETECTED
Vibrio species: NOT DETECTED
Yersinia enterocolitica: NOT DETECTED

## 2018-04-14 LAB — C DIFFICILE QUICK SCREEN W PCR REFLEX
C DIFFICILE (CDIFF) TOXIN: NEGATIVE
C DIFFICLE (CDIFF) ANTIGEN: NEGATIVE
C Diff interpretation: NOT DETECTED

## 2018-04-15 DIAGNOSIS — R197 Diarrhea, unspecified: Secondary | ICD-10-CM | POA: Insufficient documentation

## 2018-04-15 DIAGNOSIS — R1084 Generalized abdominal pain: Secondary | ICD-10-CM | POA: Insufficient documentation

## 2018-04-19 ENCOUNTER — Ambulatory Visit: Payer: BLUE CROSS/BLUE SHIELD

## 2018-04-22 ENCOUNTER — Ambulatory Visit: Payer: BLUE CROSS/BLUE SHIELD

## 2018-04-22 ENCOUNTER — Ambulatory Visit
Admission: RE | Admit: 2018-04-22 | Discharge: 2018-04-22 | Disposition: A | Discharge status: Home / Self Care | Payer: BLUE CROSS/BLUE SHIELD | Source: Ambulatory Visit | Attending: Gastroenterology | Admitting: Gastroenterology

## 2018-04-22 DIAGNOSIS — R197 Diarrhea, unspecified: Secondary | ICD-10-CM | POA: Diagnosis not present

## 2018-04-22 DIAGNOSIS — K529 Noninfective gastroenteritis and colitis, unspecified: Secondary | ICD-10-CM

## 2018-04-22 DIAGNOSIS — R140 Abdominal distension (gaseous): Secondary | ICD-10-CM

## 2018-04-22 DIAGNOSIS — K921 Melena: Secondary | ICD-10-CM | POA: Diagnosis not present

## 2018-04-22 DIAGNOSIS — R110 Nausea: Secondary | ICD-10-CM

## 2018-08-09 ENCOUNTER — Telehealth: Payer: Self-pay

## 2018-08-09 NOTE — Telephone Encounter (Signed)
Patient calling with weird thing happening to her for the past 2 years but for the past 2 months it has been worsening. Patient reports that the first thing she has notice is that her hair is falling out. 2. She is having Vertigo off and on 3.hands shaking for the past few months 4 Now daily nausea 5.Sleep Disturbances- trouble falling asleep.  6. TODAY shaking inside her body like (something is vibrating)  I asked the patient if she has taken her blood pressure states No and try to see he last A1c or sugar level none. Per patient she has been doing the Atkins diet for long time and that helps keep her sugar and colitis stable.? Patient reports that she saw GI because they thought it was coming from her Colitis but she feels something different is happening. Advised patient to go to the ED if other something comes up today with the vibration inside her body if not we will see her tomorrow at the scheduled appointment.

## 2018-08-10 ENCOUNTER — Encounter: Payer: Self-pay | Admitting: Family Medicine

## 2018-08-10 ENCOUNTER — Ambulatory Visit: Payer: BLUE CROSS/BLUE SHIELD | Admitting: Family Medicine

## 2018-08-10 VITALS — BP 138/88 | HR 76 | Temp 98.6°F | Resp 16 | Wt 133.0 lb

## 2018-08-10 DIAGNOSIS — R5382 Chronic fatigue, unspecified: Secondary | ICD-10-CM | POA: Diagnosis not present

## 2018-08-10 DIAGNOSIS — R6889 Other general symptoms and signs: Secondary | ICD-10-CM

## 2018-08-10 DIAGNOSIS — Z23 Encounter for immunization: Secondary | ICD-10-CM | POA: Diagnosis not present

## 2018-08-10 DIAGNOSIS — G4700 Insomnia, unspecified: Secondary | ICD-10-CM

## 2018-08-10 DIAGNOSIS — L659 Nonscarring hair loss, unspecified: Secondary | ICD-10-CM

## 2018-08-10 DIAGNOSIS — R110 Nausea: Secondary | ICD-10-CM

## 2018-08-10 NOTE — Progress Notes (Signed)
[name] Female    DOB: 1951/01/04   67 y.o.   MRN: [id number] Visit Date: 08/10/2018  Today's Provider: Lavon Paganini, MD   Chief Complaint  Patient presents with  . Alopecia  . Fatigue  . Dizziness    Feels off balance.   Subjective:    HPI   Patient reports several vague symptoms of years duration.  She states that this all started about 5 years ago when she took amoxicillin for URI and then had to take clindamycin for tooth infection and developed a flare of her colitis.  She has chronic diarrhea/colitis, but has not tested positive for IBD.  She is followed by GI for her colitis.  She states she has had a flare and inflammation for the last 5 years up until about 2 months ago.  Her symptoms include hair loss, dizziness, hand tremor, nausea, difficulty falling asleep, fatigue, and feeling very hot at night.  She has noticed hair loss over the last few years.  She feels like she is losing a lot more every day than she did previously and has all of her thinning.  Her hairdresser has now noticed as well.  She tried a shampoo with caffeine and it that her hairdresser recommended, but did not touch.  She has pictures for a few years ago much thicker hair.  There are no discrete bald spots.  Patient states that she constantly feels wobbly when she is walking.  There are days when it is worse that she will work from home because she is scared of driving.  She has never fallen.  She has never done physical therapy.  She feels like this, which has been ongoing for a few years, is related to all of her other symptoms.  She also has intermittent hand tremor that is not present currently.  She is nauseous every day.  She has difficulty falling asleep and some nights will stay up until 2 or 3 AM.  She does not lay down until she feels tired.  She finds that her mind is racing.  She is also very stressed at work and worries about being let go because she is 75.  She  states that she feels cold all through the day but then very hot at night.  She insists that this is not hot flashes as she went through menopause many years ago never experienced anything like this.  She is worried that she is gaining weight despite eating "so little that other people would wonder how she gets enough calories to survive."  She often goes on the Atkins diet as she believes this helps with her blood sugar and her colitis.       Allergies  Allergen Reactions  . Epinephrine Other (See Comments)    Per pt start shaking and mouth clicks tongue gets fat  . Formaldehyde Hives and Other (See Comments)    Made in creams   . Metrogel [Metronidazole] Other (See Comments)     Current Outpatient Medications:  .  cyanocobalamin 1000 MCG tablet, Take 1,000 mcg by mouth daily., Disp: , Rfl:  .  fish oil-omega-3 fatty acids 1000 MG capsule, Take 1 capsule by mouth daily. Take 1200 mg/day, Disp: , Rfl:  .  Ginkgo Biloba 40 MG TABS, Take by mouth., Disp: , Rfl:  .  hydrochlorothiazide (HYDRODIURIL) 25 MG tablet, TAKE 1 TABLET BY MOUTH DAILY, Disp: 90 tablet, Rfl: 3 .  Multiple Vitamins-Minerals (WOMENS 50+ ADVANCED  PO), Take 1 tablet by mouth daily. , Disp: , Rfl:  .  Probiotic Product (SOLUBLE FIBER/PROBIOTICS PO), Take by mouth., Disp: , Rfl:  .  Ascorbic Acid (VITAMIN C) 100 MG tablet, Take 100 mg by mouth daily., Disp: , Rfl:   Review of Systems  Constitutional: Positive for fatigue and unexpected weight change. Negative for activity change, appetite change, chills, diaphoresis and fever.  HENT: Positive for postnasal drip and rhinorrhea (Since August).   Respiratory: Positive for cough. Negative for apnea, choking, chest tightness, shortness of breath, wheezing and stridor.   Cardiovascular: Negative.   Gastrointestinal: Positive for abdominal distention. Negative for abdominal pain, anal bleeding, blood in stool, constipation, diarrhea, nausea, rectal pain and vomiting.        History of Colitis.  Pt reports this is stable.  Endocrine: Positive for cold intolerance and heat intolerance. Negative for polydipsia, polyphagia and polyuria.       Pt reports she is "cold all the time except at bedtime".  She states she gets so "hot at night that is wakes me up."   Musculoskeletal: Positive for myalgias (Cramps in her feet and legs especially at night.). Negative for arthralgias, back pain, gait problem, joint swelling, neck pain and neck stiffness.  Allergic/Immunologic: Positive for environmental allergies (Seems worse this year.).  Neurological: Positive for dizziness, tremors and numbness (Left hand). Negative for light-headedness and headaches.    Social History   Tobacco Use  . Smoking status: Former Smoker    Years: 20.00    Types: Cigarettes    Last attempt to quit: 10/18/2013    Years since quitting: 4.8  . Smokeless tobacco: Never Used  . Tobacco comment: was smoking about 5 cigarettes daily  Substance Use Topics  . Alcohol use: No   Objective:   BP 138/88 (BP Location: Right Arm, Patient Position: Sitting, Cuff Size: Normal)   Pulse 76   Temp 98.6 F (37 C) (Oral)   Resp 16   Wt 133 lb (60.3 kg)   BMI 25.13 kg/m  Vitals:   08/10/18 0857  BP: 138/88  Pulse: 76  Resp: 16  Temp: 98.6 F (37 C)  TempSrc: Oral  Weight: 133 lb (60.3 kg)     Physical Exam  Constitutional: She is oriented to person, place, and time. She appears well-developed and well-nourished. No distress.  HENT:  Head: Normocephalic and atraumatic.  Right Ear: External ear normal.  Left Ear: External ear normal.  Nose: Nose normal.  Mouth/Throat: Oropharynx is clear and moist. No oropharyngeal exudate.  Hair thin with no discrete areas of balding  Eyes: Pupils are equal, round, and reactive to light. Conjunctivae are normal. Right eye exhibits no discharge. Left eye exhibits no discharge. No scleral icterus.  Neck: Neck supple. No thyromegaly present.  Cardiovascular:  Normal rate, regular rhythm, normal heart sounds and intact distal pulses.  No murmur heard. Pulmonary/Chest: Effort normal and breath sounds normal. No respiratory distress. She has no wheezes. She has no rales.  Abdominal: Soft. She exhibits no distension. There is no tenderness.  Musculoskeletal: She exhibits no edema.  Lymphadenopathy:    She has no cervical adenopathy.  Neurological: She is alert and oriented to person, place, and time.  Skin: Skin is warm and dry. Capillary refill takes less than 2 seconds. No rash noted.  Psychiatric: She has a normal mood and affect. Her behavior is normal.  Vitals reviewed.      Assessment & Plan:   1. Chronic fatigue 2. Insomnia,  unspecified type 3. Heat intolerance 4. Nausea 5. Hair loss - vague symptoms that are chronic (present for several years) - reviewed recent labs from GI in 03/2018, including CMP, CBC, TSH, B12 that were all normal - discussed that there is likely no indication for referral or other testing - discussed that chronic GI illness may contribute to all of these symptoms - discussed return precautions  6. Need for influenza vaccination - Flu vaccine HIGH DOSE PF (Fluzone High dose)   Return if symptoms worsen or fail to improve.  Approximately 25 minutes was spent in discussion of which greater than 50% was consultation.   The entirety of the information documented in the History of Present Illness, Review of Systems and Physical Exam were personally obtained by me. Portions of this information were initially documented by Ashley Royalty, CMA and reviewed by me for thoroughness and accuracy.    [name], MD, MPH [facility] 08/10/2018 1:27 PM

## 2018-08-10 NOTE — Telephone Encounter (Signed)
Noted. Will evaluate today  Kelly Norris, [name], MD, MPH [facility] 08/10/2018 8:26 AM

## 2018-08-10 NOTE — Patient Instructions (Signed)

## 2018-12-07 ENCOUNTER — Ambulatory Visit (INDEPENDENT_AMBULATORY_CARE_PROVIDER_SITE_OTHER): Payer: BLUE CROSS/BLUE SHIELD

## 2018-12-07 ENCOUNTER — Ambulatory Visit: Payer: BLUE CROSS/BLUE SHIELD | Admitting: Family

## 2018-12-07 ENCOUNTER — Encounter: Payer: Self-pay | Admitting: Family

## 2018-12-07 VITALS — BP 130/82 | HR 91 | Temp 98.1°F | Wt 129.4 lb

## 2018-12-07 DIAGNOSIS — Z0000 Encounter for general adult medical examination without abnormal findings: Secondary | ICD-10-CM

## 2018-12-07 DIAGNOSIS — Z1239 Encounter for other screening for malignant neoplasm of breast: Secondary | ICD-10-CM

## 2018-12-07 DIAGNOSIS — J40 Bronchitis, not specified as acute or chronic: Secondary | ICD-10-CM

## 2018-12-07 DIAGNOSIS — R197 Diarrhea, unspecified: Secondary | ICD-10-CM

## 2018-12-07 DIAGNOSIS — R110 Nausea: Secondary | ICD-10-CM

## 2018-12-07 DIAGNOSIS — I10 Essential (primary) hypertension: Secondary | ICD-10-CM

## 2018-12-07 MED ORDER — ONDANSETRON 4 MG PO TBDP: 4.0000 mg | ORAL_TABLET | Freq: Three times a day (TID) | ORAL | 0 refills | Status: DC | PRN

## 2018-12-07 MED ORDER — AZITHROMYCIN 250 MG PO TABS: ORAL_TABLET | ORAL | 0 refills | Status: DC

## 2018-12-07 NOTE — Progress Notes (Signed)
Subjective:    Patient ID: Kelly Norris, female    DOB: 29-Sep-1951, 68 y.o.   MRN: [id number]  CC: Emmarie Norris is a 68 y.o. female who presents today to establish care.    HPI: CC: cough, congestion, x 2 weeks, unchanged. Feels SOB when breathing through nose from congestion. A lot of nasal congestion   Feeling run down since the suspected flu 3 weeks ago. Was seen at urgent care, reports they did not test for flu. Had fever, ha, cough, diarrhea for one week, symptoms improved at that time.   A couple of days later congestion started with diarrhea with abdominal cramping. Diarrhea once per day.  Feels different than chronic diarrhea. Has had chronic intermittent diarrhea from 'colitis.' Doesn't feel like 'typical colitis.' No epigastric burning. Associated with nausea for past 6 days. No abdominal pain, vomiting, fever.   Some belching. Excessive gas which is normal for her.  Watery brown stool. No blood in stool.  Established with [facility].   Will take probiotics for colitis. Resolves with imodium, recurs the next day.   No stress at this time.   HTN- compliant with HCTZ . No CP.           Folllowing with Dr Jacqulyn Liner gastroenterology  Colitis- 03/2018. Reviewed note IBS instead of IBD.   Colonoscopy 2018.  HISTORY:  Past Medical History:  Diagnosis Date  . Allergy   . Depression   . Endometriosis 1994  . H/O breast biopsy    x 3  . H/O hydronephrosis    left   . Hypertension    2004  . Kidney trouble    previous stent in the left kidney  . Osteoarthritis    cervical   . Paresthesias    thumb  . Seasonal allergies    Past Surgical History:  Procedure Laterality Date  . ABDOMINAL HYSTERECTOMY  1994   Partial (does not have cervix); endometriosis  . COLONOSCOPY WITH PROPOFOL N/A 12/25/2016   Procedure: COLONOSCOPY WITH PROPOFOL;  Surgeon: Lollie Sails, MD;  Location: [facility] ENDOSCOPY;  Service: Endoscopy;  Laterality: N/A;  . DILATION AND  CURETTAGE OF UTERUS  92-94   2 times between 92-94   Family History  Problem Relation Age of Onset  . Hypertension Mother   . Breast cancer Mother        age 84  . Cancer Mother   . Colon polyps Mother   . Depression Mother   . Colitis Mother   . Hypertension Father   . Prostate cancer Father   . Heart disease Father        bypass surgery in his late 75 's  . Colon polyps Father   . Colitis Father   . Colon polyps Sister   . Colitis Sister   . Colon polyps Brother   . Colitis Brother   . Colon polyps Brother   . Colitis Brother   . Valvular heart disease Daughter        valvular heart disease bacterial endocarditis but no premature history  of cardiac  disease    Allergies: Epinephrine; Formaldehyde; and Metrogel [metronidazole] Current Outpatient Medications on File Prior to Visit  Medication Sig Dispense Refill  . Ascorbic Acid (VITAMIN C) 100 MG tablet Take 100 mg by mouth daily.    . cyanocobalamin 1000 MCG tablet Take 1,000 mcg by mouth daily.    . fish oil-omega-3 fatty acids 1000 MG capsule Take 1 capsule by mouth daily. Take 1200 mg/day    .  Ginkgo Biloba 40 MG TABS Take by mouth.    . hydrochlorothiazide (HYDRODIURIL) 25 MG tablet TAKE 1 TABLET BY MOUTH DAILY 90 tablet 3  . Multiple Vitamins-Minerals (WOMENS 50+ ADVANCED PO) Take 1 tablet by mouth daily.     . Probiotic Product (SOLUBLE FIBER/PROBIOTICS PO) Take by mouth.     No current facility-administered medications on file prior to visit.     Social History   Tobacco Use  . Smoking status: Former Smoker    Years: 20.00    Types: Cigarettes    Last attempt to quit: 10/18/2013    Years since quitting: 5.1  . Smokeless tobacco: Never Used  . Tobacco comment: was smoking about 5 cigarettes daily  Substance Use Topics  . Alcohol use: No  . Drug use: No    Review of Systems  Constitutional: Positive for fatigue. Negative for chills and fever.  HENT: Positive for congestion. Negative for ear pain,  postnasal drip, rhinorrhea, sinus pressure, sinus pain and sore throat.   Respiratory: Positive for cough and shortness of breath. Negative for wheezing.   Cardiovascular: Negative for chest pain and palpitations.  Gastrointestinal: Positive for diarrhea. Negative for abdominal distention, abdominal pain, blood in stool, constipation, nausea and vomiting.      Objective:    BP 130/82 (BP Location: Left Arm, Patient Position: Sitting, Cuff Size: Normal)   Pulse 91   Temp 98.1 F (36.7 C)   Wt 129 lb 6.4 oz (58.7 kg)   SpO2 96%   BMI 24.45 kg/m  BP Readings from Last 3 Encounters:  12/07/18 130/82  08/10/18 138/88  01/24/18 120/82   Wt Readings from Last 3 Encounters:  12/07/18 129 lb 6.4 oz (58.7 kg)  08/10/18 133 lb (60.3 kg)  01/24/18 126 lb 12.8 oz (57.5 kg)    Physical Exam Vitals signs reviewed.  Constitutional:      Appearance: Normal appearance. She is well-developed.  HENT:     Head: Normocephalic and atraumatic.     Right Ear: Hearing, tympanic membrane, ear canal and external ear normal. No decreased hearing noted. No drainage, swelling or tenderness. No middle ear effusion. No foreign body. Tympanic membrane is not erythematous or bulging.     Left Ear: Hearing, tympanic membrane, ear canal and external ear normal. No decreased hearing noted. No drainage, swelling or tenderness.  No middle ear effusion. No foreign body. Tympanic membrane is not erythematous or bulging.     Nose: Congestion present. No rhinorrhea.     Right Sinus: No maxillary sinus tenderness or frontal sinus tenderness.     Left Sinus: No maxillary sinus tenderness or frontal sinus tenderness.     Mouth/Throat:     Pharynx: Uvula midline. No oropharyngeal exudate or posterior oropharyngeal erythema.     Tonsils: No tonsillar abscesses.  Eyes:     Conjunctiva/sclera: Conjunctivae normal.  Cardiovascular:     Rate and Rhythm: Normal rate and regular rhythm.     Pulses: Normal pulses.     Heart  sounds: Normal heart sounds.  Pulmonary:     Effort: Pulmonary effort is normal.     Breath sounds: Normal breath sounds. No wheezing, rhonchi or rales.  Abdominal:     General: Bowel sounds are normal. There is no distension.     Palpations: Abdomen is soft. Abdomen is not rigid. There is no fluid wave or mass.     Tenderness: There is no abdominal tenderness. There is no guarding or rebound.  Lymphadenopathy:  Head:     Right side of head: No submental, submandibular, tonsillar, preauricular, posterior auricular or occipital adenopathy.     Left side of head: No submental, submandibular, tonsillar, preauricular, posterior auricular or occipital adenopathy.     Cervical: No cervical adenopathy.  Skin:    General: Skin is warm and dry.  Neurological:     Mental Status: She is alert.  Psychiatric:        Speech: Speech normal.        Behavior: Behavior normal.        Thought Content: Thought content normal.        Assessment & Plan:   Problem List Items Addressed This Visit      Cardiovascular and Mediastinum   Hypertension    At goal, continue regimen.         Respiratory   Bronchitis - Primary    No acute respiratory distress, SaO2 96%.  Patient is nontoxic in appearance.  From HPI, appears patient initially had viral URI, based on duration of symptoms, and return thereof, we jointly agreed antibiotic would be appropriate in this setting suspected bacterial URI.  Pending chest x-ray as well.  Close follow-up      Relevant Medications   azithromycin (ZITHROMAX) 250 MG tablet   Other Relevant Orders   DG Chest 2 View (Completed)     Other   Diarrhea    Chronic however of late, presentation has changed.  Self-limiting, in the morning.  We jointly agreed second opinion with [facility] gastroenterology is most appropriate next step.  Will follow      Relevant Orders   Ambulatory referral to Gastroenterology   Screening for breast cancer    Ordered, patient will  schedule.      Relevant Orders   MM 3D SCREEN BREAST BILATERAL   Nausea    Etiology unclear, considering congestion at this time.  Benign abdominal exam.  No vomiting.  will treat for bronchitis, congestion with close follow-up.      Relevant Medications   ondansetron (ZOFRAN ODT) 4 MG disintegrating tablet   Encounter for medical examination to establish care    Reviewed past medical history. Records in chart.       Relevant Orders   TSH (Completed)   CBC with Differential/Platelet (Completed)   Comprehensive metabolic panel (Completed)   Hemoglobin A1c (Completed)   Lipid panel (Completed)   VITAMIN D 25 Hydroxy (Vit-D Deficiency, Fractures) (Completed)       I am having Haynes Dage Taylor-Hicks start on azithromycin and ondansetron. I am also having her maintain her fish oil-omega-3 fatty acids, Multiple Vitamins-Minerals (WOMENS 50+ ADVANCED PO), Ginkgo Biloba, Probiotic Product (SOLUBLE FIBER/PROBIOTICS PO), cyanocobalamin, vitamin C, and hydrochlorothiazide.   Meds ordered this encounter  Medications  . azithromycin (ZITHROMAX) 250 MG tablet    Sig: Tale 500 mg PO on day 1, then 250 mg PO q24h x 4 days.    Dispense:  6 tablet    Refill:  0    Order Specific Question:   Supervising Provider    Answer:   Derrel Nip, TERESA L [[id number]]  . ondansetron (ZOFRAN ODT) 4 MG disintegrating tablet    Sig: Take 1 tablet (4 mg total) by mouth every 8 (eight) hours as needed for nausea or vomiting.    Dispense:  20 tablet    Refill:  0    Order Specific Question:   Supervising Provider    Answer:   Crecencio Mc [[id number]]    Return  precautions given.   Risks, benefits, and alternatives of the medications and treatment plan prescribed today were discussed, and patient expressed understanding.   Education regarding symptom management and diagnosis given to patient on AVS.  Continue to follow with Brita Romp, [name], MD for routine health maintenance.   Luna Fuse and I  agreed with plan.   Mable Paris, FNP

## 2018-12-07 NOTE — Patient Instructions (Addendum)
Start zpak  Ensure to take probiotics while on antibiotics and also for 2 weeks after completion. It is important to re-colonize the gut with good bacteria and also to prevent any diarrheal infections associated with antibiotic use.   Mucinex ( plain) with water  Labs   We placed a referral for mammogram this year. I asked that you call one the below locations and schedule this when it is convenient for you.   As discussed, I would like you to ask for 3D mammogram over the traditional 2D mammogram as new evidence suggest 3D is superior.   Please note that NOT all insurance companies cover 3D and you may have to pay a higher copay. You may call your insurance company to further clarify your benefits.   Options for [place]  [place], [place]  * [facility] [place] [place] * Note if you ask for 3D mammogram at this location, you must request [place], [place] location*

## 2018-12-08 LAB — COMPREHENSIVE METABOLIC PANEL
ALT: 11 U/L (ref 0–35)
AST: 14 U/L (ref 0–37)
Albumin: 4.5 g/dL (ref 3.5–5.2)
Alkaline Phosphatase: 118 U/L — ABNORMAL HIGH (ref 39–117)
BUN: 14 mg/dL (ref 6–23)
CALCIUM: 9.9 mg/dL (ref 8.4–10.5)
CHLORIDE: 98 meq/L (ref 96–112)
CO2: 34 mEq/L — ABNORMAL HIGH (ref 19–32)
CREATININE: 0.75 mg/dL (ref 0.40–1.20)
GFR: 77.36 mL/min (ref >60.00)
Glucose, Bld: 99 mg/dL (ref 70–99)
POTASSIUM: 3.2 meq/L — AB (ref 3.5–5.1)
Sodium: 141 mEq/L (ref 135–145)
Total Bilirubin: 0.5 mg/dL (ref 0.2–1.2)
Total Protein: 7.3 g/dL (ref 6.0–8.3)

## 2018-12-08 LAB — CBC WITH DIFFERENTIAL/PLATELET
BASOS PCT: 0.6 % (ref 0.0–3.0)
Basophils Absolute: 0 10*3/uL (ref 0.0–0.1)
EOS PCT: 2 % (ref 0.0–5.0)
Eosinophils Absolute: 0.1 10*3/uL (ref 0.0–0.7)
HEMATOCRIT: 41.5 % (ref 36.0–46.0)
HEMOGLOBIN: 14.3 g/dL (ref 12.0–15.0)
LYMPHS PCT: 25.2 % (ref 12.0–46.0)
Lymphs Abs: 1.8 10*3/uL (ref 0.7–4.0)
MCHC: 34.5 g/dL (ref 30.0–36.0)
MCV: 93.4 fl (ref 78.0–100.0)
Monocytes Absolute: 0.7 10*3/uL (ref 0.1–1.0)
Monocytes Relative: 9.9 % (ref 3.0–12.0)
NEUTROS ABS: 4.5 10*3/uL (ref 1.4–7.7)
Neutrophils Relative %: 62.3 % (ref 43.0–77.0)
Platelets: 311 10*3/uL (ref 150.0–400.0)
RBC: 4.44 Mil/uL (ref 3.87–5.11)
RDW: 13.9 % (ref 11.5–15.5)
WBC: 7.1 10*3/uL (ref 4.0–10.5)

## 2018-12-08 LAB — LIPID PANEL
CHOL/HDL RATIO: 3
CHOLESTEROL: 204 mg/dL — AB (ref 0–200)
HDL: 73.3 mg/dL (ref >39.00)
LDL Cholesterol: 114 mg/dL — ABNORMAL HIGH (ref 0–99)
NonHDL: 130.81
TRIGLYCERIDES: 84 mg/dL (ref 0.0–149.0)
VLDL: 16.8 mg/dL (ref 0.0–40.0)

## 2018-12-08 LAB — HEMOGLOBIN A1C: Hgb A1c MFr Bld: 5.3 % (ref 4.6–6.5)

## 2018-12-08 LAB — VITAMIN D 25 HYDROXY (VIT D DEFICIENCY, FRACTURES): VITD: 48.97 ng/mL (ref 30.00–100.00)

## 2018-12-08 LAB — TSH: TSH: 0.99 u[IU]/mL (ref 0.35–4.50)

## 2018-12-09 ENCOUNTER — Other Ambulatory Visit: Payer: Self-pay | Admitting: Family

## 2018-12-09 DIAGNOSIS — J40 Bronchitis, not specified as acute or chronic: Secondary | ICD-10-CM | POA: Insufficient documentation

## 2018-12-09 DIAGNOSIS — Z0000 Encounter for general adult medical examination without abnormal findings: Secondary | ICD-10-CM | POA: Insufficient documentation

## 2018-12-09 DIAGNOSIS — I10 Essential (primary) hypertension: Secondary | ICD-10-CM

## 2018-12-09 DIAGNOSIS — R197 Diarrhea, unspecified: Secondary | ICD-10-CM | POA: Insufficient documentation

## 2018-12-09 DIAGNOSIS — R110 Nausea: Secondary | ICD-10-CM | POA: Insufficient documentation

## 2018-12-09 DIAGNOSIS — Z1239 Encounter for other screening for malignant neoplasm of breast: Secondary | ICD-10-CM | POA: Insufficient documentation

## 2018-12-09 NOTE — Assessment & Plan Note (Signed)
At goal, continue regimen. 

## 2018-12-09 NOTE — Assessment & Plan Note (Signed)
No acute respiratory distress, SaO2 96%.  Patient is nontoxic in appearance.  From HPI, appears patient initially had viral URI, based on duration of symptoms, and return thereof, we jointly agreed antibiotic would be appropriate in this setting suspected bacterial URI.  Pending chest x-ray as well.  Close follow-up

## 2018-12-09 NOTE — Assessment & Plan Note (Signed)
Ordered, patient will schedule 

## 2018-12-09 NOTE — Assessment & Plan Note (Signed)
Reviewed past medical history. Records in chart.

## 2018-12-09 NOTE — Assessment & Plan Note (Signed)
Chronic however of late, presentation has changed.  Self-limiting, in the morning.  We jointly agreed second opinion with [facility] gastroenterology is most appropriate next step.  Will follow

## 2018-12-09 NOTE — Assessment & Plan Note (Signed)
Etiology unclear, considering congestion at this time.  Benign abdominal exam.  No vomiting.  will treat for bronchitis, congestion with close follow-up.

## 2018-12-12 ENCOUNTER — Other Ambulatory Visit: Payer: Self-pay | Admitting: Family

## 2018-12-12 ENCOUNTER — Other Ambulatory Visit (INDEPENDENT_AMBULATORY_CARE_PROVIDER_SITE_OTHER): Payer: BLUE CROSS/BLUE SHIELD

## 2018-12-12 DIAGNOSIS — I10 Essential (primary) hypertension: Secondary | ICD-10-CM

## 2018-12-12 LAB — COMPREHENSIVE METABOLIC PANEL
ALT: 12 U/L (ref 0–35)
AST: 17 U/L (ref 0–37)
Albumin: 4.4 g/dL (ref 3.5–5.2)
Alkaline Phosphatase: 118 U/L — ABNORMAL HIGH (ref 39–117)
BUN: 12 mg/dL (ref 6–23)
CO2: 35 mEq/L — ABNORMAL HIGH (ref 19–32)
Calcium: 9.6 mg/dL (ref 8.4–10.5)
Chloride: 97 mEq/L (ref 96–112)
Creatinine, Ser: 0.76 mg/dL (ref 0.40–1.20)
GFR: 76.19 mL/min (ref >60.00)
GLUCOSE: 102 mg/dL — AB (ref 70–99)
Potassium: 3.3 mEq/L — ABNORMAL LOW (ref 3.5–5.1)
Sodium: 141 mEq/L (ref 135–145)
Total Bilirubin: 0.4 mg/dL (ref 0.2–1.2)
Total Protein: 7 g/dL (ref 6.0–8.3)

## 2018-12-12 MED ORDER — HYDROCHLOROTHIAZIDE 12.5 MG PO CAPS: 12.5000 mg | ORAL_CAPSULE | Freq: Every day | ORAL | 0 refills | Status: DC

## 2018-12-12 MED ORDER — POTASSIUM CHLORIDE ER 10 MEQ PO TBCR: EXTENDED_RELEASE_TABLET | ORAL | 0 refills | Status: DC

## 2018-12-12 MED ORDER — AMLODIPINE BESYLATE 2.5 MG PO TABS: 2.5000 mg | ORAL_TABLET | Freq: Every day | ORAL | 3 refills | Status: DC

## 2018-12-15 ENCOUNTER — Other Ambulatory Visit (INDEPENDENT_AMBULATORY_CARE_PROVIDER_SITE_OTHER): Payer: BLUE CROSS/BLUE SHIELD

## 2018-12-15 DIAGNOSIS — I10 Essential (primary) hypertension: Secondary | ICD-10-CM

## 2018-12-15 LAB — BASIC METABOLIC PANEL
BUN: 13 mg/dL (ref 6–23)
CO2: 32 mEq/L (ref 19–32)
Calcium: 9.6 mg/dL (ref 8.4–10.5)
Chloride: 100 mEq/L (ref 96–112)
Creatinine, Ser: 0.74 mg/dL (ref 0.40–1.20)
GFR: 78.56 mL/min (ref >60.00)
Glucose, Bld: 96 mg/dL (ref 70–99)
Potassium: 3.4 mEq/L — ABNORMAL LOW (ref 3.5–5.1)
Sodium: 141 mEq/L (ref 135–145)

## 2018-12-19 ENCOUNTER — Encounter: Payer: Self-pay | Admitting: Family

## 2018-12-19 ENCOUNTER — Ambulatory Visit: Payer: BLUE CROSS/BLUE SHIELD | Admitting: Family

## 2018-12-19 VITALS — BP 112/68 | HR 75 | Temp 97.7°F | Wt 131.4 lb

## 2018-12-19 DIAGNOSIS — I10 Essential (primary) hypertension: Secondary | ICD-10-CM | POA: Diagnosis not present

## 2018-12-19 DIAGNOSIS — B958 Unspecified staphylococcus as the cause of diseases classified elsewhere: Secondary | ICD-10-CM

## 2018-12-19 DIAGNOSIS — J40 Bronchitis, not specified as acute or chronic: Secondary | ICD-10-CM | POA: Diagnosis not present

## 2018-12-19 DIAGNOSIS — R197 Diarrhea, unspecified: Secondary | ICD-10-CM | POA: Diagnosis not present

## 2018-12-19 MED ORDER — MUPIROCIN 2 % EX OINT: 1.0000 "application " | TOPICAL_OINTMENT | Freq: Two times a day (BID) | CUTANEOUS | 0 refills | Status: DC

## 2018-12-19 NOTE — Patient Instructions (Addendum)
Trial of saline nasal spray  May trial flonase - every other day.    Pick up 12.5mg  HCTZ ,  amlodipine 2.5mg  .   Monitor blood pressure,  Goal is less than 120/80, based on newest guidelines; if persistently higher, please make sooner follow up appointment so we can recheck you blood pressure and manage medications  We will work to expedite your GI referral to [place].  Please keep [place] informed of your appointment.

## 2018-12-19 NOTE — Progress Notes (Signed)
Subjective:    Patient ID: Kelly Norris, female    DOB: Nov 13, 1950, 68 y.o.   MRN: [id number]  CC: Kelly Norris is a 68 y.o. female who presents today for follow up.   HPI: Feels well today, no new concerns.    HTN-Still on HCTZ 25mg . Has been on Kcl once per day.  Denies exertional chest pain or pressure, numbness or tingling radiating to left arm or jaw, palpitations, dizziness, frequent headaches, changes in vision, or shortness of breath.    Pending appointment with [facility] gastroenterology.  Has to send medical records there prior  Diarrhea- persistent. Unchanged. Watery stools. Working from home as cannot be in office. Imodium does help. On probiotics.    Bronchitis-given azithromycin and 'feels much better.' Energy has improved. Yesterday noted cough, and post nasal drip returning. Has sore on left nostril. No h/o mrsa. No fever.   Allergic to ragweed. On claritin.    Nausea has resolved.    HISTORY:  Past Medical History:  Diagnosis Date  . Allergy   . Depression   . Endometriosis 1994  . H/O breast biopsy    x 3  . H/O hydronephrosis    left   . Hypertension    2004  . Kidney trouble    previous stent in the left kidney  . Osteoarthritis    cervical   . Paresthesias    thumb  . Seasonal allergies    Past Surgical History:  Procedure Laterality Date  . ABDOMINAL HYSTERECTOMY  1994   Partial (does not have cervix); endometriosis  . COLONOSCOPY WITH PROPOFOL N/A 12/25/2016   Procedure: COLONOSCOPY WITH PROPOFOL;  Surgeon: Lollie Sails, MD;  Location: [facility] ENDOSCOPY;  Service: Endoscopy;  Laterality: N/A;  . DILATION AND CURETTAGE OF UTERUS  92-94   2 times between 92-94   Family History  Problem Relation Age of Onset  . Hypertension Mother   . Breast cancer Mother        age 90  . Cancer Mother   . Colon polyps Mother   . Depression Mother   . Colitis Mother   . Hypertension Father   . Prostate cancer Father   . Heart disease  Father        bypass surgery in his late 73 's  . Colon polyps Father   . Colitis Father   . Colon polyps Sister   . Colitis Sister   . Colon polyps Brother   . Colitis Brother   . Colon polyps Brother   . Colitis Brother   . Valvular heart disease Daughter        valvular heart disease bacterial endocarditis but no premature history  of cardiac  disease    Allergies: Epinephrine; Formaldehyde; and Metrogel [metronidazole] Current Outpatient Medications on File Prior to Visit  Medication Sig Dispense Refill  . Ascorbic Acid (VITAMIN C) 100 MG tablet Take 100 mg by mouth daily.    Marland Kitchen azithromycin (ZITHROMAX) 250 MG tablet Tale 500 mg PO on day 1, then 250 mg PO q24h x 4 days. 6 tablet 0  . cyanocobalamin 1000 MCG tablet Take 1,000 mcg by mouth daily.    . fish oil-omega-3 fatty acids 1000 MG capsule Take 1 capsule by mouth daily. Take 1200 mg/day    . Ginkgo Biloba 40 MG TABS Take by mouth.    . Multiple Vitamins-Minerals (WOMENS 50+ ADVANCED PO) Take 1 tablet by mouth daily.     . ondansetron (ZOFRAN ODT) 4 MG disintegrating  tablet Take 1 tablet (4 mg total) by mouth every 8 (eight) hours as needed for nausea or vomiting. 20 tablet 0  . potassium chloride (K-DUR) 10 MEQ tablet Take two doses today 12/12/2018 30 tablet 0  . Probiotic Product (SOLUBLE FIBER/PROBIOTICS PO) Take by mouth.     No current facility-administered medications on file prior to visit.     Social History   Tobacco Use  . Smoking status: Former Smoker    Years: 20.00    Types: Cigarettes    Last attempt to quit: 10/18/2013    Years since quitting: 5.1  . Smokeless tobacco: Never Used  . Tobacco comment: was smoking about 5 cigarettes daily  Substance Use Topics  . Alcohol use: No  . Drug use: No    Review of Systems  Constitutional: Negative for chills and fever.  HENT: Positive for postnasal drip. Negative for sinus pressure, sinus pain, sore throat and trouble swallowing.   Respiratory: Negative for  cough.   Cardiovascular: Negative for chest pain and palpitations.  Gastrointestinal: Positive for diarrhea. Negative for abdominal distention, abdominal pain, blood in stool, nausea and vomiting.  Skin: Positive for wound (left nostril).      Objective:    BP 112/68 (BP Location: Left Arm, Patient Position: Sitting, Cuff Size: Large)   Pulse 75   Temp 97.7 F (36.5 C)   Wt 131 lb 6.4 oz (59.6 kg)   SpO2 96%   BMI 24.83 kg/m  BP Readings from Last 3 Encounters:  12/19/18 112/68  12/07/18 130/82  08/10/18 138/88   Wt Readings from Last 3 Encounters:  12/19/18 131 lb 6.4 oz (59.6 kg)  12/07/18 129 lb 6.4 oz (58.7 kg)  08/10/18 133 lb (60.3 kg)    Physical Exam Vitals signs reviewed.  Constitutional:      Appearance: She is well-developed.  HENT:     Head: Normocephalic and atraumatic.     Right Ear: Hearing, tympanic membrane, ear canal and external ear normal. No decreased hearing noted. No drainage, swelling or tenderness. No middle ear effusion. No foreign body. Tympanic membrane is not erythematous or bulging.     Left Ear: Hearing, tympanic membrane, ear canal and external ear normal. No decreased hearing noted. No drainage, swelling or tenderness.  No middle ear effusion. No foreign body. Tympanic membrane is not erythematous or bulging.     Nose: Nose normal. No rhinorrhea.     Right Sinus: No maxillary sinus tenderness or frontal sinus tenderness.     Left Sinus: No maxillary sinus tenderness or frontal sinus tenderness.      Comments: Left nostril papule noted.  No purulent discharge no drainage.    Mouth/Throat:     Pharynx: Uvula midline. No oropharyngeal exudate or posterior oropharyngeal erythema.     Tonsils: No tonsillar abscesses.  Eyes:     Conjunctiva/sclera: Conjunctivae normal.  Cardiovascular:     Rate and Rhythm: Normal rate and regular rhythm.     Pulses: Normal pulses.     Heart sounds: Normal heart sounds.  Pulmonary:     Effort: Pulmonary  effort is normal.     Breath sounds: Normal breath sounds. No wheezing, rhonchi or rales.  Lymphadenopathy:     Head:     Right side of head: No submental, submandibular, tonsillar, preauricular, posterior auricular or occipital adenopathy.     Left side of head: No submental, submandibular, tonsillar, preauricular, posterior auricular or occipital adenopathy.     Cervical: No cervical adenopathy.  Skin:    General: Skin is warm and dry.  Neurological:     Mental Status: She is alert.  Psychiatric:        Speech: Speech normal.        Behavior: Behavior normal.        Thought Content: Thought content normal.        Assessment & Plan:   Problem List Items Addressed This Visit      Cardiovascular and Mediastinum   Hypertension - Primary    Controlled however discussed at length my concern with patient's chronic diarrhea and low potassium.  I have asked her again to start 12.5 HCTZ.  She will also start amlodipine.  She will monitor blood pressure.  Pending labs today.      Relevant Orders   Basic metabolic panel (Completed)     Respiratory   Bronchitis    Largely improved.  Suspect seasonal allergies may also be playing a role.  Advised nasal saline spray, Flonase.  Patient will continue Claritin.  She will let me know how she is doing.        Other   Diarrhea    Unchanged.  Patient declines any imaging, stool testing today.  We will await consult with gastroenterology.  She will let me know if symptoms were to change.      Staph infection    Suspected MRSA lesion.  Patient will treat with Bactroban and let me know if no improvement.          I am having Kelly Norris maintain her fish oil-omega-3 fatty acids, Multiple Vitamins-Minerals (WOMENS 50+ ADVANCED PO), Ginkgo Biloba, Probiotic Product (SOLUBLE FIBER/PROBIOTICS PO), cyanocobalamin, vitamin C, azithromycin, ondansetron, and potassium chloride.   Meds ordered this encounter  Medications  . DISCONTD:  mupirocin ointment (BACTROBAN) 2 %    Sig: Place 1 application into the nose 2 (two) times daily.    Dispense:  22 g    Refill:  0    Order Specific Question:   Supervising Provider    Answer:   Crecencio Mc [[id number]]    Return precautions given.   Risks, benefits, and alternatives of the medications and treatment plan prescribed today were discussed, and patient expressed understanding.   Education regarding symptom management and diagnosis given to patient on AVS.  Continue to follow with Burnard Hawthorne, FNP for routine health maintenance.   Kelly Norris and I agreed with plan.   Mable Paris, FNP

## 2018-12-20 ENCOUNTER — Telehealth: Payer: Self-pay

## 2018-12-20 ENCOUNTER — Other Ambulatory Visit: Payer: Self-pay | Admitting: Family

## 2018-12-20 ENCOUNTER — Other Ambulatory Visit: Payer: Self-pay

## 2018-12-20 DIAGNOSIS — I10 Essential (primary) hypertension: Secondary | ICD-10-CM

## 2018-12-20 DIAGNOSIS — B958 Unspecified staphylococcus as the cause of diseases classified elsewhere: Secondary | ICD-10-CM

## 2018-12-20 LAB — BASIC METABOLIC PANEL
BUN: 11 mg/dL (ref 6–23)
CO2: 28 mEq/L (ref 19–32)
Calcium: 9.8 mg/dL (ref 8.4–10.5)
Chloride: 100 mEq/L (ref 96–112)
Creatinine, Ser: 0.67 mg/dL (ref 0.40–1.20)
GFR: 88.11 mL/min (ref >60.00)
Glucose, Bld: 104 mg/dL — ABNORMAL HIGH (ref 70–99)
Potassium: 3.1 mEq/L — ABNORMAL LOW (ref 3.5–5.1)
Sodium: 139 mEq/L (ref 135–145)

## 2018-12-20 MED ORDER — MUPIROCIN 2 % EX OINT: 1.0000 "application " | TOPICAL_OINTMENT | Freq: Two times a day (BID) | CUTANEOUS | 0 refills | Status: DC

## 2018-12-20 MED ORDER — AMLODIPINE BESYLATE 2.5 MG PO TABS: 2.5000 mg | ORAL_TABLET | Freq: Every day | ORAL | 3 refills | Status: DC

## 2018-12-20 MED ORDER — HYDROCHLOROTHIAZIDE 12.5 MG PO CAPS: 12.5000 mg | ORAL_CAPSULE | Freq: Every day | ORAL | 0 refills | Status: DC

## 2018-12-20 NOTE — Telephone Encounter (Signed)
Requesting change of pharmacy Requested Prescriptions  Pending Prescriptions Disp Refills  . amLODipine (NORVASC) 2.5 MG tablet 90 tablet 3    Sig: Take 1 tablet (2.5 mg total) by mouth daily.     Cardiovascular:  Calcium Channel Blockers Passed - 12/20/2018  3:31 PM      Passed - Last BP in normal range    BP Readings from Last 1 Encounters:  12/19/18 112/68         Passed - Valid encounter within last 6 months    Recent Outpatient Visits          Yesterday Essential hypertension   [place] [facility], Yvetta Coder, FNP   1 week ago [place], Yvetta Coder, [place]   6 years ago Hypertension   [facility], Renette Butters, MD      Future Appointments            In 2 months Arnett, Yvetta Coder, FNP [place], [facility]         . hydrochlorothiazide (MICROZIDE) 12.5 MG capsule 90 capsule 0    Sig: Take 1 capsule (12.5 mg total) by mouth daily.     Cardiovascular: Diuretics - Thiazide Failed - 12/20/2018  3:31 PM      Failed - K in normal range and within 360 days    Potassium  Date Value Ref Range Status  12/19/2018 3.1 (L) 3.5 - 5.1 mEq/L Final         Passed - Ca in normal range and within 360 days    Calcium  Date Value Ref Range Status  12/19/2018 9.8 8.4 - 10.5 mg/dL Final         Passed - Cr in normal range and within 360 days    Creatinine, Ser  Date Value Ref Range Status  12/19/2018 0.67 0.40 - 1.20 mg/dL Final         Passed - Na in normal range and within 360 days    Sodium  Date Value Ref Range Status  12/19/2018 139 135 - 145 mEq/L Final  01/09/2016 145 (H) 134 - 144 mmol/L Final         Passed - Last BP in normal range    BP Readings from Last 1 Encounters:  12/19/18 112/68         Passed - Valid encounter within last 6 months    Recent Outpatient Visits          Yesterday Essential hypertension   [employer], Yvetta Coder, FNP   1 week  ago Bronchitis   [place], Yvetta Coder, [place]   6 years ago Hypertension   [facility] at [place], Renette Butters, MD      Future Appointments            In 2 months [facility], Yvetta Coder, FNP [place] [facility] [place]         . mupirocin ointment (BACTROBAN) 2 % 22 g 0    Sig: Place 1 application into the nose 2 (two) times daily.     Off-Protocol Failed - 12/20/2018  3:31 PM      Failed - Medication not assigned to a protocol, review manually.      Passed - Valid encounter within last 12 months    Recent Outpatient Visits          Yesterday Essential hypertension     [facility] [place], FNP   1 week ago [place], Yvetta Coder, FNP   6 years ago Hypertension   [facility] at [place], Renette Butters, MD      Future Appointments            In 2 months Arnett, Yvetta Coder, [place], [facility]

## 2018-12-20 NOTE — Telephone Encounter (Signed)
I spoke with patient & she is scheduled to recheck labs next Monday for low potassium.

## 2018-12-20 NOTE — Telephone Encounter (Signed)
Copied from [place] [id number]. Topic: Quick Communication - Rx Refill/Question >> Dec 20, 2018  3:11 PM Scherrie Gerlach wrote: Medication: amLODipine (NORVASC) 2.5 MG tablet hydrochlorothiazide (MICROZIDE) 12.5 MG capsule 90 day mupirocin ointment (BACTROBAN) 2 %  Pt needs these sent to a different pharmacy.  When she got to [facility] today, they told her they do not accept her insurnace. Please resend to [place] #[id number] Lorina Rabon, [place] [phone] (Phone) [phone] (Fax)

## 2018-12-20 NOTE — Telephone Encounter (Signed)
-----   Message from Burnard Hawthorne, [place] sent at 12/20/2018  1:50 PM EST ----- Please call pt Her potassium is 3.1 She is now on hctz 12.5mg  Ensure she started this yesterday  She needs to take 2 doses today of KCl 10 meq and 2 doses tomorrow of KCl 10 meq.( total of 4 doses) . She may stop the KCl after the 4 doses.   She is also now on amlodipine 2.5mg   Please order and Schedule repeat BMP for one week

## 2018-12-21 ENCOUNTER — Telehealth: Payer: Self-pay | Admitting: *Deleted

## 2018-12-21 DIAGNOSIS — B958 Unspecified staphylococcus as the cause of diseases classified elsewhere: Secondary | ICD-10-CM | POA: Insufficient documentation

## 2018-12-21 NOTE — Assessment & Plan Note (Signed)
Controlled however discussed at length my concern with patient's chronic diarrhea and low potassium.  I have asked her again to start 12.5 HCTZ.  She will also start amlodipine.  She will monitor blood pressure.  Pending labs today.

## 2018-12-21 NOTE — Telephone Encounter (Signed)
-----   Message from Burnard Hawthorne, [place] sent at 12/20/2018  1:50 PM EST ----- Please call pt Kelly Norris potassium is 3.1 She is now on hctz 12.5mg  Ensure she started this yesterday  She needs to take 2 doses today of KCl 10 meq and 2 doses tomorrow of KCl 10 meq.( total of 4 doses) . She may stop the KCl after the 4 doses.   She is also now on amlodipine 2.5mg   Please order and Schedule repeat BMP for one week

## 2018-12-21 NOTE — Assessment & Plan Note (Signed)
Largely improved.  Suspect seasonal allergies may also be playing a role.  Advised nasal saline spray, Flonase.  Patient will continue Claritin.  She will let me know how she is doing.

## 2018-12-21 NOTE — Assessment & Plan Note (Signed)
Unchanged.  Patient declines any imaging, stool testing today.  We will await consult with gastroenterology.  She will let me know if symptoms were to change.

## 2018-12-21 NOTE — Assessment & Plan Note (Signed)
Suspected MRSA lesion.  Patient will treat with Bactroban and let me know if no improvement.

## 2018-12-21 NOTE — Telephone Encounter (Signed)
See previous phone note patient scheduled.

## 2018-12-26 ENCOUNTER — Other Ambulatory Visit: Payer: BLUE CROSS/BLUE SHIELD

## 2018-12-27 ENCOUNTER — Other Ambulatory Visit (INDEPENDENT_AMBULATORY_CARE_PROVIDER_SITE_OTHER): Payer: BLUE CROSS/BLUE SHIELD

## 2018-12-27 DIAGNOSIS — I10 Essential (primary) hypertension: Secondary | ICD-10-CM | POA: Diagnosis not present

## 2018-12-27 LAB — BASIC METABOLIC PANEL
BUN: 14 mg/dL (ref 6–23)
CALCIUM: 9.7 mg/dL (ref 8.4–10.5)
CO2: 30 mEq/L (ref 19–32)
Chloride: 102 mEq/L (ref 96–112)
Creatinine, Ser: 0.67 mg/dL (ref 0.40–1.20)
GFR: 88.1 mL/min (ref >60.00)
Glucose, Bld: 120 mg/dL — ABNORMAL HIGH (ref 70–99)
Potassium: 3.4 mEq/L — ABNORMAL LOW (ref 3.5–5.1)
Sodium: 140 mEq/L (ref 135–145)

## 2018-12-28 ENCOUNTER — Other Ambulatory Visit: Payer: Self-pay | Admitting: Family

## 2018-12-30 ENCOUNTER — Telehealth: Payer: Self-pay | Admitting: Internal Medicine

## 2018-12-30 NOTE — Telephone Encounter (Signed)
Pt was referred over to our office to see Dr. Hilarie Fredrickson from her PCP. Patient is transferring care. Records have been sent to Dr. Hilarie Fredrickson. Please advise for scheduling.

## 2019-01-02 ENCOUNTER — Encounter: Payer: Self-pay | Admitting: Family

## 2019-01-04 ENCOUNTER — Other Ambulatory Visit: Payer: Self-pay | Admitting: Family

## 2019-01-04 NOTE — Telephone Encounter (Signed)
Call pt  Since she is no longer HCTZ, please advise patient that I declined to refill potassium chloride.  This medication really should only be taking in patients that have diuretic therapy, or another reason for chronically low potassium.    Now that she is on amlodipine, I suspect this will be resolved.  We can certainly discuss at upcoming office visit.

## 2019-01-04 NOTE — Telephone Encounter (Signed)
Last OV 12/19/2018  Not on pt's medication list   Potassium last checked 12/27/2018 @ 3.4 normal is 3.5 to 5.1  Sent to PCP to advise

## 2019-01-09 ENCOUNTER — Ambulatory Visit: Payer: BLUE CROSS/BLUE SHIELD | Admitting: Family

## 2019-01-09 ENCOUNTER — Encounter: Payer: Self-pay | Admitting: Family

## 2019-01-09 ENCOUNTER — Other Ambulatory Visit: Payer: Self-pay

## 2019-01-09 VITALS — BP 120/80 | HR 88 | Temp 98.1°F | Wt 133.8 lb

## 2019-01-09 DIAGNOSIS — M7989 Other specified soft tissue disorders: Secondary | ICD-10-CM | POA: Diagnosis not present

## 2019-01-09 DIAGNOSIS — R197 Diarrhea, unspecified: Secondary | ICD-10-CM

## 2019-01-09 DIAGNOSIS — I10 Essential (primary) hypertension: Secondary | ICD-10-CM | POA: Diagnosis not present

## 2019-01-09 MED ORDER — HYDROCHLOROTHIAZIDE 12.5 MG PO CAPS: ORAL_CAPSULE | ORAL | 0 refills | Status: DC

## 2019-01-09 NOTE — Telephone Encounter (Signed)
Hi Jesica,   Hope you are well.  Im seeing patient today as her pcc and wanted to see when patient may be scheduled with yall. I know a lot going on; she just wanted to be seen in the next couple of months  Thanks for looking into this.   Joycelyn Schmid, NP

## 2019-01-09 NOTE — Progress Notes (Signed)
Subjective:    Patient ID: Kelly Norris, female    DOB: 02-01-1951, 68 y.o.   MRN: [id number]  CC: Kelly Norris is a 68 y.o. female who presents today for follow up.   HPI: Feels well today, no new concerns.    HTN-no longer on HCTZ.  Doing well on amlodipine.  Some swelling in legs since stopped HCTZ.  Worse to the end of the day.  No swelling today.  No shortness of breath, orthopnea, chest  Chronic diarrhea-improved, "feels more like my colitis".  loose stools now , had been watery diarrhea. NO blood in stools. No weight loss.   records have been reviewed Kelly Norris.   HISTORY:  Past Medical History:  Diagnosis Date  . Allergy   . Depression   . Endometriosis 1994  . H/O breast biopsy    x 3  . H/O hydronephrosis    left   . Hypertension    2004  . Kidney trouble    previous stent in the left kidney  . Osteoarthritis    cervical   . Paresthesias    thumb  . Seasonal allergies    Past Surgical History:  Procedure Laterality Date  . ABDOMINAL HYSTERECTOMY  1994   Partial (does not have cervix); endometriosis  . COLONOSCOPY WITH PROPOFOL N/A 12/25/2016   Procedure: COLONOSCOPY WITH PROPOFOL;  Surgeon: Kelly Sails, MD;  Location: [facility] ENDOSCOPY;  Service: Endoscopy;  Laterality: N/A;  . DILATION AND CURETTAGE OF UTERUS  92-94   2 times between 92-94   Family History  Problem Relation Age of Onset  . Hypertension Mother   . Breast cancer Mother        age 72  . Cancer Mother   . Colon polyps Mother   . Depression Mother   . Colitis Mother   . Hypertension Father   . Prostate cancer Father   . Heart disease Father        bypass surgery in his late 69 's  . Colon polyps Father   . Colitis Father   . Colon polyps Sister   . Colitis Sister   . Colon polyps Brother   . Colitis Brother   . Colon polyps Brother   . Colitis Brother   . Valvular heart disease Daughter        valvular heart disease bacterial endocarditis but no premature  history  of cardiac  disease    Allergies: Epinephrine; Formaldehyde; and Metrogel [metronidazole] Current Outpatient Medications on File Prior to Visit  Medication Sig Dispense Refill  . amLODipine (NORVASC) 2.5 MG tablet Take 1 tablet (2.5 mg total) by mouth daily. 90 tablet 3  . cyanocobalamin 1000 MCG tablet Take 1,000 mcg by mouth daily.    . fish oil-omega-3 fatty acids 1000 MG capsule Take 1 capsule by mouth daily. Take 1200 mg/day    . Ginkgo Biloba 40 MG TABS Take by mouth.    . Multiple Vitamins-Minerals (WOMENS 50+ ADVANCED PO) Take 1 tablet by mouth daily.     . Probiotic Product (SOLUBLE FIBER/PROBIOTICS PO) Take by mouth.     No current facility-administered medications on file prior to visit.     Social History   Tobacco Use  . Smoking status: Former Smoker    Years: 20.00    Types: Cigarettes    Last attempt to quit: 10/18/2013    Years since quitting: 5.2  . Smokeless tobacco: Never Used  . Tobacco comment: was smoking about 5 cigarettes daily  Substance Use Topics  . Alcohol use: No  . Drug use: No    Review of Systems  Constitutional: Negative for chills and fever.  Respiratory: Negative for cough.   Cardiovascular: Negative for chest pain and palpitations.  Gastrointestinal: Positive for diarrhea (chronic). Negative for abdominal distention, abdominal pain, nausea and vomiting.      Objective:    BP 120/80 (BP Location: Left Arm, Patient Position: Sitting, Cuff Size: Large)   Pulse 88   Temp 98.1 F (36.7 C)   Wt 133 lb 12.8 oz (60.7 kg)   SpO2 98%   BMI 25.28 kg/m  BP Readings from Last 3 Encounters:  01/09/19 120/80  12/19/18 112/68  12/07/18 130/82   Wt Readings from Last 3 Encounters:  01/09/19 133 lb 12.8 oz (60.7 kg)  12/19/18 131 lb 6.4 oz (59.6 kg)  12/07/18 129 lb 6.4 oz (58.7 kg)    Physical Exam Vitals signs reviewed.  Constitutional:      Appearance: She is well-developed.  Eyes:     Conjunctiva/sclera: Conjunctivae  normal.  Cardiovascular:     Rate and Rhythm: Normal rate and regular rhythm.     Pulses: Normal pulses.     Heart sounds: Normal heart sounds.     Comments: No LE edema, palpable cords or masses. No erythema or increased warmth. No asymmetry in calf size when compared bilaterally LE hair growth symmetric and present. No discoloration of varicosities noted. LE warm and palpable pedal pulses.  Pulmonary:     Effort: Pulmonary effort is normal.     Breath sounds: Normal breath sounds. No wheezing, rhonchi or rales.  Skin:    General: Skin is warm and dry.  Neurological:     Mental Status: She is alert.  Psychiatric:        Speech: Speech normal.        Behavior: Behavior normal.        Thought Content: Thought content normal.        Assessment & Plan:   Problem List Items Addressed This Visit      Cardiovascular and Mediastinum   Hypertension - Primary    Well-controlled, continue current regimen.      Relevant Medications   hydrochlorothiazide (MICROZIDE) 12.5 MG capsule     Other   Diarrhea    Chronic however improved.  Awaiting appointment with Kelly Norris  I have reached out to their office in regards to scheduling.  Patient politely states she is in no rush to see GI and happy waiting couple months especially in setting of current environment with COVID 19.  She will let me know of any new or worsening symptoms      Leg swelling    No edema appreciated on exam today.  Discussed with patient lifestyle measures including elevation, compression stockings, low-salt.  Advised that she may use low-dose HCTZ 12.5 mg  twice per weekly prn for swelling.  She will certainly let me know if this is not enough.          I have discontinued Kelly Norris's vitamin C, azithromycin, ondansetron, and mupirocin ointment. I am also having her start on hydrochlorothiazide. Additionally, I am having her maintain her fish oil-omega-3 fatty acids, Multiple Vitamins-Minerals  (WOMENS 50+ ADVANCED PO), Ginkgo Biloba, Probiotic Product (SOLUBLE FIBER/PROBIOTICS PO), cyanocobalamin, and amLODipine.   Meds ordered this encounter  Medications  . hydrochlorothiazide (MICROZIDE) 12.5 MG capsule    Sig: Take one tablet twice daily per week as needed.  Dispense:  90 capsule    Refill:  0    Order Specific Question:   Supervising Provider    Answer:   Crecencio Mc [[id number]]    Return precautions given.   Risks, benefits, and alternatives of the medications and treatment plan prescribed today were discussed, and patient expressed understanding.   Education regarding symptom management and diagnosis given to patient on AVS.  Continue to follow with Burnard Hawthorne, FNP for routine health maintenance.   Luna Fuse and I agreed with plan.   Mable Paris, FNP

## 2019-01-09 NOTE — Patient Instructions (Signed)
HCTZ daily two days for week  Let me know how you are doing   Stay safe!

## 2019-01-11 ENCOUNTER — Ambulatory Visit: Payer: BLUE CROSS/BLUE SHIELD | Admitting: Family

## 2019-01-11 DIAGNOSIS — M7989 Other specified soft tissue disorders: Secondary | ICD-10-CM | POA: Insufficient documentation

## 2019-01-11 NOTE — Assessment & Plan Note (Signed)
Well-controlled, continue current regimen 

## 2019-01-11 NOTE — Assessment & Plan Note (Signed)
No edema appreciated on exam today.  Discussed with patient lifestyle measures including elevation, compression stockings, low-salt.  Advised that she may use low-dose HCTZ 12.5 mg  twice per weekly prn for swelling.  She will certainly let me know if this is not enough.

## 2019-01-11 NOTE — Assessment & Plan Note (Signed)
Chronic however improved.  Awaiting appointment with Dr. Riley Kill  I have reached out to their office in regards to scheduling.  Patient politely states she is in no rush to see GI and happy waiting couple months especially in setting of current environment with COVID 19.  She will let me know of any new or worsening symptoms

## 2019-01-26 NOTE — Telephone Encounter (Signed)
Dr.Pyrtle reviewed records and accepted patient for office visit. Called patient who states she does not think she needs a virtual visit at this time and would like to schedule in office in June. Records placed in file.

## 2019-01-30 ENCOUNTER — Encounter: Payer: BLUE CROSS/BLUE SHIELD | Admitting: Family Medicine

## 2019-02-24 ENCOUNTER — Ambulatory Visit: Payer: BLUE CROSS/BLUE SHIELD | Admitting: Family

## 2019-03-03 ENCOUNTER — Other Ambulatory Visit: Payer: Self-pay

## 2019-03-10 ENCOUNTER — Encounter: Payer: BLUE CROSS/BLUE SHIELD | Admitting: Family

## 2019-03-17 ENCOUNTER — Other Ambulatory Visit: Payer: Self-pay

## 2019-03-20 ENCOUNTER — Other Ambulatory Visit: Payer: Self-pay

## 2019-03-20 ENCOUNTER — Ambulatory Visit (INDEPENDENT_AMBULATORY_CARE_PROVIDER_SITE_OTHER): Payer: BLUE CROSS/BLUE SHIELD | Admitting: Family

## 2019-03-20 ENCOUNTER — Encounter: Payer: Self-pay | Admitting: Family

## 2019-03-20 ENCOUNTER — Telehealth: Payer: Self-pay

## 2019-03-20 ENCOUNTER — Other Ambulatory Visit: Payer: Self-pay | Admitting: Family Medicine

## 2019-03-20 ENCOUNTER — Telehealth: Payer: Self-pay | Admitting: *Deleted

## 2019-03-20 VITALS — BP 118/64 | HR 98 | Temp 98.6°F | Wt 140.6 lb

## 2019-03-20 DIAGNOSIS — I10 Essential (primary) hypertension: Secondary | ICD-10-CM

## 2019-03-20 DIAGNOSIS — R197 Diarrhea, unspecified: Secondary | ICD-10-CM | POA: Diagnosis not present

## 2019-03-20 DIAGNOSIS — Z0000 Encounter for general adult medical examination without abnormal findings: Secondary | ICD-10-CM | POA: Diagnosis not present

## 2019-03-20 DIAGNOSIS — Z122 Encounter for screening for malignant neoplasm of respiratory organs: Secondary | ICD-10-CM | POA: Diagnosis not present

## 2019-03-20 DIAGNOSIS — M7989 Other specified soft tissue disorders: Secondary | ICD-10-CM

## 2019-03-20 DIAGNOSIS — Z23 Encounter for immunization: Secondary | ICD-10-CM | POA: Diagnosis not present

## 2019-03-20 MED ORDER — HYDROCHLOROTHIAZIDE 12.5 MG PO CAPS: ORAL_CAPSULE | ORAL | 0 refills | Status: DC

## 2019-03-20 MED ORDER — HYDROCHLOROTHIAZIDE 25 MG PO TABS: 25.0000 mg | ORAL_TABLET | Freq: Every day | ORAL | 3 refills | Status: DC

## 2019-03-20 NOTE — Telephone Encounter (Signed)
Copied from [place] [id number]. Topic: Quick Communication - Rx Refill/Question >> Mar 20, 2019 10:48 AM Scherrie Gerlach wrote: Medication: hydrochlorothiazide (MICROZIDE) 12.5 MG capsule  Pt had this Rx on file.  Please call pharmacy and clarify directions. Sig: Take one tablet twice daily per week as needed  [place] #[id number] Lorina Rabon, [place] [phone] (Phone) [phone] (Fax)

## 2019-03-20 NOTE — Progress Notes (Signed)
Subjective:    Patient ID: Kelly Norris, female    DOB: 1950-11-07, 68 y.o.   MRN: [id number]  CC: Kelly Norris is a 68 y.o. female who presents today for physical exam.    HPI: HPI  Has been furloughed however coping well, and optimistic.   Leg swelling- worsened of late. 'great in the morning' and worse throughout the day.  Taking hctz 12.5mg  daily. Doesn't feel that urinatiing as 'much as should' which is why she started taking daily.No trouble urinating or weakness of stream. No orthopnea, sob.   HTN- compliant with with amlodipine. Feels fuzzy headed on amlodipine and felt better on hctz.  Denies exertional chest pain or pressure, numbness or tingling radiating to left arm or jaw, palpitations, dizziness, frequent headaches, changes in vision, or shortness of breath.   Diarrhea 'in a good phase.' No recent flares.   Colorectal Cancer Screening: UTD , Dr Gustavo Lah 12/2016. Repeat in 5 years.  Breast Cancer Screening: Mammogram DUE Cervical Cancer Screening: No longer screens. Per patient, No cervix. No vaginal bleeding.  Bone Health screening/DEXA for 65+: due Lung Cancer Screening: Meets criteria.        Tetanus - utd        Pneumococcal - Candidate for Hepatitis C screening - Candidate for, consents.   Labs: Screening labs today. Exercise: Gets regular exercise.  Alcohol use: none Smoking/tobacco use: FORMER smoker.  Regular dental exams: UTD Wears seat belt: Yes.   HISTORY:  Past Medical History:  Diagnosis Date  . Allergy   . Depression   . Endometriosis 1994  . H/O breast biopsy    x 3  . H/O hydronephrosis    left   . Hypertension    2004  . Kidney trouble    previous stent in the left kidney  . Osteoarthritis    cervical   . Paresthesias    thumb  . Seasonal allergies     Past Surgical History:  Procedure Laterality Date  . ABDOMINAL HYSTERECTOMY  1994   Partial (does not have cervix); endometriosis  . COLONOSCOPY WITH PROPOFOL  N/A 12/25/2016   Procedure: COLONOSCOPY WITH PROPOFOL;  Surgeon: Lollie Sails, MD;  Location: [facility] ENDOSCOPY;  Service: Endoscopy;  Laterality: N/A;  . DILATION AND CURETTAGE OF UTERUS  92-94   2 times between 92-94   Family History  Problem Relation Age of Onset  . Hypertension Mother   . Breast cancer Mother        age 43  . Cancer Mother   . Colon polyps Mother   . Depression Mother   . Colitis Mother   . Hypertension Father   . Prostate cancer Father   . Heart disease Father        bypass surgery in his late 41 's  . Colon polyps Father   . Colitis Father   . Colon polyps Sister   . Colitis Sister   . Colon polyps Brother   . Colitis Brother   . Colon polyps Brother   . Colitis Brother   . Valvular heart disease Daughter        valvular heart disease bacterial endocarditis but no premature history  of cardiac  disease      ALLERGIES: Epinephrine; Formaldehyde; and Metrogel [metronidazole]  Current Outpatient Medications on File Prior to Visit  Medication Sig Dispense Refill  . cyanocobalamin 1000 MCG tablet Take 1,000 mcg by mouth daily.    . fish oil-omega-3 fatty acids 1000 MG capsule Take 1  capsule by mouth daily. Take 1200 mg/day    . Ginkgo Biloba 40 MG TABS Take by mouth.    . Multiple Vitamins-Minerals (WOMENS 50+ ADVANCED PO) Take 1 tablet by mouth daily.     . Probiotic Product (SOLUBLE FIBER/PROBIOTICS PO) Take by mouth.     No current facility-administered medications on file prior to visit.     Social History   Tobacco Use  . Smoking status: Former Smoker    Years: 20.00    Types: Cigarettes    Last attempt to quit: 10/18/2013    Years since quitting: 5.4  . Smokeless tobacco: Never Used  . Tobacco comment: was smoking about 5 cigarettes daily  Substance Use Topics  . Alcohol use: No  . Drug use: No    Review of Systems  Constitutional: Negative for chills, fever and unexpected weight change.  HENT: Negative for congestion.    Respiratory: Negative for cough.   Cardiovascular: Negative for chest pain, palpitations and leg swelling.  Gastrointestinal: Negative for nausea and vomiting.  Musculoskeletal: Negative for arthralgias and myalgias.  Skin: Negative for rash.  Neurological: Negative for headaches.  Hematological: Negative for adenopathy.  Psychiatric/Behavioral: Negative for confusion.      Objective:    BP 118/64 (BP Location: Left Arm, Patient Position: Sitting, Cuff Size: Large)   Pulse 98   Temp 98.6 F (37 C)   Wt 140 lb 9.6 oz (63.8 kg)   SpO2 98%   BMI 26.57 kg/m   BP Readings from Last 3 Encounters:  03/20/19 118/64  01/09/19 120/80  12/19/18 112/68   Wt Readings from Last 3 Encounters:  03/20/19 140 lb 9.6 oz (63.8 kg)  01/09/19 133 lb 12.8 oz (60.7 kg)  12/19/18 131 lb 6.4 oz (59.6 kg)    Physical Exam Vitals signs reviewed.  Constitutional:      Appearance: She is well-developed.  Eyes:     Conjunctiva/sclera: Conjunctivae normal.  Neck:     Thyroid: No thyroid mass or thyromegaly.  Cardiovascular:     Rate and Rhythm: Normal rate and regular rhythm.     Pulses: Normal pulses.     Heart sounds: Normal heart sounds.  Pulmonary:     Effort: Pulmonary effort is normal.     Breath sounds: Normal breath sounds. No wheezing, rhonchi or rales.  Chest:     Breasts: Breasts are symmetrical.        Right: No inverted nipple, mass, nipple discharge, skin change or tenderness.        Left: No inverted nipple, mass, nipple discharge, skin change or tenderness.  Lymphadenopathy:     Head:     Right side of head: No submental, submandibular, tonsillar, preauricular, posterior auricular or occipital adenopathy.     Left side of head: No submental, submandibular, tonsillar, preauricular, posterior auricular or occipital adenopathy.     Cervical: No cervical adenopathy.     Right cervical: No superficial, deep or posterior cervical adenopathy.    Left cervical: No superficial, deep  or posterior cervical adenopathy.  Skin:    General: Skin is warm and dry.  Neurological:     Mental Status: She is alert.  Psychiatric:        Speech: Speech normal.        Behavior: Behavior normal.        Thought Content: Thought content normal.        Assessment & Plan:   Problem List Items Addressed This Visit  Cardiovascular and Mediastinum   Hypertension    Patient do not feel amlodipine, will stop medication.  Will start back to prior dose of hydrochlorothiazide 25 mg for HTN, leg swelling as had been more effective and this is patient's preference.  Advised patient to be extremely cautious with this dose due to her h/o hypokalemia.  She will monitor blood pressure, will repeat BMP.  Will establish if she also needs to take potassium while on this medication. Will follow      Relevant Medications   hydrochlorothiazide (HYDRODIURIL) 25 MG tablet     Other   Diarrhea    Resolved at this time.  Patient understands to notify me if she has another flare of diarrhea particularly since she is on potent diuretic.  Patient verbalized understanding of this      Annual physical exam - Primary    Clinical breast exam performed today.  Deferred pelvic exam in the absence of complaints, no cervix, and she is no longer screening.  Due for mammogram and patient will schedule      Relevant Orders   Comprehensive metabolic panel   MM 3D SCREEN BREAST BILATERAL   DG Bone Density   CT CHEST LUNG CANCER SCREENING LOW DOSE WO CONTRAST   Hepatitis C antibody   Leg swelling    Not as well-controlled as on 12.5 mg of hydrochlorothiazide.  Will return to prior dose close vigilance of electrolytes.      Relevant Medications   hydrochlorothiazide (HYDRODIURIL) 25 MG tablet   Other Relevant Orders   Urinalysis    Other Visit Diagnoses    Need for 23-polyvalent pneumococcal polysaccharide vaccine       Relevant Orders   Pneumococcal polysaccharide vaccine 23-valent greater than or  equal to 2yo subcutaneous/IM (Completed)   Encounter for screening for malignant neoplasm of respiratory organs       Relevant Orders   CT CHEST LUNG CANCER SCREENING LOW DOSE WO CONTRAST       I have discontinued Kelly Norris amLODipine and hydrochlorothiazide. I am also having her start on hydrochlorothiazide. Additionally, I am having her maintain her fish oil-omega-3 fatty acids, Multiple Vitamins-Minerals (WOMENS 50+ ADVANCED PO), Ginkgo Biloba, Probiotic Product (SOLUBLE FIBER/PROBIOTICS PO), and cyanocobalamin.   Meds ordered this encounter  Medications  . hydrochlorothiazide (HYDRODIURIL) 25 MG tablet    Sig: Take 1 tablet (25 mg total) by mouth daily.    Dispense:  90 tablet    Refill:  3    Dc 12.5mg  HCTZ dose; this 25mg  is to replace. Thank you. Joycelyn Schmid , NP    Order Specific Question:   Supervising Provider    Answer:   Crecencio Mc [[id number]]    Return precautions given.   Risks, benefits, and alternatives of the medications and treatment plan prescribed today were discussed, and patient expressed understanding.   Education regarding symptom management and diagnosis given to patient on AVS.   Continue to follow with Burnard Hawthorne, FNP for routine health maintenance.   Luna Fuse and I agreed with plan.   Mable Paris, FNP

## 2019-03-20 NOTE — Patient Instructions (Addendum)
Stop amlodipine  Increase HCTZ to '25mg'$  daily; however we must carefully watch potassium, kidney function. If you have diarrhea in the future, please let me know.   Call us when you can in regards to scheduling labs; I want to ensure your potassium is normal especially since increasing the potassium.   We placed a referral for mammogram this year  Please call [facility] below and schedule your mammogram  Please also ask if they bone density. Let me know if not as then we can schedule at [facility] which is at [facility].    [facility] [place], [place] * Note if you ask for 3D mammogram at this location, you must request [place], [place] location*   Please reach out to dermatology and schedule annual, follow up.   Wonderful always to see you!  Stay safe!   Health Maintenance for Postmenopausal Women Menopause is a normal process in which your reproductive ability comes to an end. This process happens gradually over a span of months to years, usually between the ages of 1 and 25. Menopause is complete when you have missed 12 consecutive menstrual periods. It is important to talk with your health care provider about some of the most common conditions that affect postmenopausal women, such as heart disease, cancer, and bone loss (osteoporosis). Adopting a healthy lifestyle and getting preventive care can help to promote your health and wellness. Those actions can also lower your chances of developing some of these common conditions. What should I know about menopause? During menopause, you may experience a number of symptoms, such as:  Moderate-to-severe hot flashes.  Night sweats.  Decrease in sex drive.  Mood swings.  Headaches.  Tiredness.  Irritability.  Memory problems.  Insomnia. Choosing to treat or not to treat menopausal changes is an individual decision that you make with your health care provider. What should I know about hormone  replacement therapy and supplements? Hormone therapy products are effective for treating symptoms that are associated with menopause, such as hot flashes and night sweats. Hormone replacement carries certain risks, especially as you become older. If you are thinking about using estrogen or estrogen with progestin treatments, discuss the benefits and risks with your health care provider. What should I know about heart disease and stroke? Heart disease, heart attack, and stroke become more likely as you age. This may be due, in part, to the hormonal changes that your body experiences during menopause. These can affect how your body processes dietary fats, triglycerides, and cholesterol. Heart attack and stroke are both medical emergencies. There are many things that you can do to help prevent heart disease and stroke:  Have your blood pressure checked at least every 1-2 years. High blood pressure causes heart disease and increases the risk of stroke.  If you are 22-33 years old, ask your health care provider if you should take aspirin to prevent a heart attack or a stroke.  Do not use any tobacco products, including cigarettes, chewing tobacco, or electronic cigarettes. If you need help quitting, ask your health care provider.  It is important to eat a healthy diet and maintain a healthy weight. ? Be sure to include plenty of vegetables, fruits, low-fat dairy products, and lean protein. ? Avoid eating foods that are high in solid fats, added sugars, or salt (sodium).  Get regular exercise. This is one of the most important things that you can do for your health. ? Try to exercise for at least 150  minutes each week. The type of exercise that you do should increase your heart rate and make you sweat. This is known as moderate-intensity exercise. ? Try to do strengthening exercises at least twice each week. Do these in addition to the moderate-intensity exercise.  Know your numbers.Ask your health  care provider to check your cholesterol and your blood glucose. Continue to have your blood tested as directed by your health care provider.  What should I know about cancer screening? There are several types of cancer. Take the following steps to reduce your risk and to catch any cancer development as early as possible. Breast Cancer  Practice breast self-awareness. ? This means understanding how your breasts normally appear and feel. ? It also means doing regular breast self-exams. Let your health care provider know about any changes, no matter how small.  If you are 32 or older, have a clinician do a breast exam (clinical breast exam or CBE) every year. Depending on your age, family history, and medical history, it may be recommended that you also have a yearly breast X-ray (mammogram).  If you have a family history of breast cancer, talk with your health care provider about genetic screening.  If you are at high risk for breast cancer, talk with your health care provider about having an MRI and a mammogram every year.  Breast cancer (BRCA) gene test is recommended for women who have family members with BRCA-related cancers. Results of the assessment will determine the need for genetic counseling and BRCA1 and for BRCA2 testing. BRCA-related cancers include these types: ? Breast. This occurs in males or females. ? Ovarian. ? Tubal. This may also be called fallopian tube cancer. ? Cancer of the abdominal or pelvic lining (peritoneal cancer). ? Prostate. ? Pancreatic. Cervical, Uterine, and Ovarian Cancer Your health care provider may recommend that you be screened regularly for cancer of the pelvic organs. These include your ovaries, uterus, and vagina. This screening involves a pelvic exam, which includes checking for microscopic changes to the surface of your cervix (Pap test).  For women ages 21-65, health care providers may recommend a pelvic exam and a Pap test every three years. For  women ages 9-65, they may recommend the Pap test and pelvic exam, combined with testing for human papilloma virus (HPV), every five years. Some types of HPV increase your risk of cervical cancer. Testing for HPV may also be done on women of any age who have unclear Pap test results.  Other health care providers may not recommend any screening for nonpregnant women who are considered low risk for pelvic cancer and have no symptoms. Ask your health care provider if a screening pelvic exam is right for you.  If you have had past treatment for cervical cancer or a condition that could lead to cancer, you need Pap tests and screening for cancer for at least 20 years after your treatment. If Pap tests have been discontinued for you, your risk factors (such as having a new sexual partner) need to be reassessed to determine if you should start having screenings again. Some women have medical problems that increase the chance of getting cervical cancer. In these cases, your health care provider may recommend that you have screening and Pap tests more often.  If you have a family history of uterine cancer or ovarian cancer, talk with your health care provider about genetic screening.  If you have vaginal bleeding after reaching menopause, tell your health care provider.  There are  currently no reliable tests available to screen for ovarian cancer. Lung Cancer Lung cancer screening is recommended for adults 87-5 years old who are at high risk for lung cancer because of a history of smoking. A yearly low-dose CT scan of the lungs is recommended if you:  Currently smoke.  Have a history of at least 30 pack-years of smoking and you currently smoke or have quit within the past 15 years. A pack-year is smoking an average of one pack of cigarettes per day for one year. Yearly screening should:  Continue until it has been 15 years since you quit.  Stop if you develop a health problem that would prevent you from  having lung cancer treatment. Colorectal Cancer  This type of cancer can be detected and can often be prevented.  Routine colorectal cancer screening usually begins at age 19 and continues through age 24.  If you have risk factors for colon cancer, your health care provider may recommend that you be screened at an earlier age.  If you have a family history of colorectal cancer, talk with your health care provider about genetic screening.  Your health care provider may also recommend using home test kits to check for hidden blood in your stool.  A small camera at the end of a tube can be used to examine your colon directly (sigmoidoscopy or colonoscopy). This is done to check for the earliest forms of colorectal cancer.  Direct examination of the colon should be repeated every 5-10 years until age 31. However, if early forms of precancerous polyps or small growths are found or if you have a family history or genetic risk for colorectal cancer, you may need to be screened more often. Skin Cancer  Check your skin from head to toe regularly.  Monitor any moles. Be sure to tell your health care provider: ? About any new moles or changes in moles, especially if there is a change in a mole's shape or color. ? If you have a mole that is larger than the size of a pencil eraser.  If any of your family members has a history of skin cancer, especially at a young age, talk with your health care provider about genetic screening.  Always use sunscreen. Apply sunscreen liberally and repeatedly throughout the day.  Whenever you are outside, protect yourself by wearing long sleeves, pants, a wide-brimmed hat, and sunglasses. What should I know about osteoporosis? Osteoporosis is a condition in which bone destruction happens more quickly than new bone creation. After menopause, you may be at an increased risk for osteoporosis. To help prevent osteoporosis or the bone fractures that can happen because of  osteoporosis, the following is recommended:  If you are 60-29 years old, get at least 1,000 mg of calcium and at least 600 mg of vitamin D per day.  If you are older than age 23 but younger than age 70, get at least 1,200 mg of calcium and at least 600 mg of vitamin D per day.  If you are older than age 23, get at least 1,200 mg of calcium and at least 800 mg of vitamin D per day. Smoking and excessive alcohol intake increase the risk of osteoporosis. Eat foods that are rich in calcium and vitamin D, and do weight-bearing exercises several times each week as directed by your health care provider. What should I know about how menopause affects my mental health? Depression may occur at any age, but it is more common as you  become older. Common symptoms of depression include:  Low or sad mood.  Changes in sleep patterns.  Changes in appetite or eating patterns.  Feeling an overall lack of motivation or enjoyment of activities that you previously enjoyed.  Frequent crying spells. Talk with your health care provider if you think that you are experiencing depression. What should I know about immunizations? It is important that you get and maintain your immunizations. These include:  Tetanus, diphtheria, and pertussis (Tdap) booster vaccine.  Influenza every year before the flu season begins.  Pneumonia vaccine.  Shingles vaccine. Your health care provider may also recommend other immunizations. This information is not intended to replace advice given to you by your health care provider. Make sure you discuss any questions you have with your health care provider. Document Released: 11/27/2005 Document Revised: 04/24/2016 Document Reviewed: 07/09/2015 Elsevier Interactive Patient Education  2019 [employer].

## 2019-03-20 NOTE — Telephone Encounter (Signed)
See Rx request. Believe this is your patient now. Thanks!

## 2019-03-20 NOTE — Telephone Encounter (Signed)
I have corrected and resent script.

## 2019-03-20 NOTE — Telephone Encounter (Signed)
Patient has appointment today, but I have sent to pharmacy.

## 2019-03-22 ENCOUNTER — Telehealth: Payer: Self-pay | Admitting: *Deleted

## 2019-03-22 NOTE — Telephone Encounter (Signed)
noted 

## 2019-03-22 NOTE — Assessment & Plan Note (Signed)
Clinical breast exam performed today.  Deferred pelvic exam in the absence of complaints, no cervix, and she is no longer screening.  Due for mammogram and patient will schedule

## 2019-03-22 NOTE — Assessment & Plan Note (Signed)
Resolved at this time.  Patient understands to notify me if she has another flare of diarrhea particularly since she is on potent diuretic.  Patient verbalized understanding of this

## 2019-03-22 NOTE — Assessment & Plan Note (Signed)
Patient do not feel amlodipine, will stop medication.  Will start back to prior dose of hydrochlorothiazide 25 mg for HTN, leg swelling as had been more effective and this is patient's preference.  Advised patient to be extremely cautious with this dose due to her h/o hypokalemia.  She will monitor blood pressure, will repeat BMP.  Will establish if she also needs to take potassium while on this medication. Will follow

## 2019-03-22 NOTE — Assessment & Plan Note (Signed)
Not as well-controlled as on 12.5 mg of hydrochlorothiazide.  Will return to prior dose close vigilance of electrolytes.

## 2019-03-22 NOTE — Telephone Encounter (Signed)
Received referral for initial lung cancer screening scan. Contacted patient and obtained smoking history of .25 packs per day x 30 years. Patient has quit smoking. Discussed that patient is not eligible to have lung screening due to pack year history being less than 30. Patient verbalizes understanding.

## 2019-05-04 ENCOUNTER — Encounter: Payer: Self-pay | Admitting: *Deleted

## 2019-05-08 ENCOUNTER — Encounter: Payer: Self-pay | Admitting: *Deleted

## 2019-05-09 ENCOUNTER — Ambulatory Visit (INDEPENDENT_AMBULATORY_CARE_PROVIDER_SITE_OTHER): Payer: Medicare Other | Admitting: Internal Medicine

## 2019-05-09 ENCOUNTER — Encounter: Payer: Self-pay | Admitting: Internal Medicine

## 2019-05-09 VITALS — Ht 61.0 in | Wt 140.0 lb

## 2019-05-09 DIAGNOSIS — Z86010 Personal history of colonic polyps: Secondary | ICD-10-CM | POA: Diagnosis not present

## 2019-05-09 DIAGNOSIS — R195 Other fecal abnormalities: Secondary | ICD-10-CM

## 2019-05-09 DIAGNOSIS — K529 Noninfective gastroenteritis and colitis, unspecified: Secondary | ICD-10-CM

## 2019-05-09 NOTE — Addendum Note (Signed)
Addended by: Larina Bras on: 05/09/2019 05:02 PM   Modules accepted: Orders

## 2019-05-09 NOTE — Patient Instructions (Addendum)
Your provider has requested that you go to the basement level for lab work before leaving today. Press "B" on the elevator. The lab is located at the first door on the left as you exit the elevator.  You will be due for a recall colonoscopy in 12/2021. We will send you a reminder in the mail when it gets closer to that time. If procedure date needs to changed to a sooner date, we will let you know.  If you are age 66 or older, your body mass index should be between 23-30. Your Body mass index is 26.45 kg/m. If this is out of the aforementioned range listed, please consider follow up with your Primary Care Provider.  If you are age 19 or younger, your body mass index should be between 19-25. Your Body mass index is 26.45 kg/m. If this is out of the aformentioned range listed, please consider follow up with your Primary Care Provider.

## 2019-05-09 NOTE — Progress Notes (Signed)
Patient ID: Kelly Norris, female   DOB: [birth year], 68 y.o.   MRN: [id number]  HPI:  This service was provided via telemedicine.  Doximity app with AV communication The patient was located at home The provider was located in provider's GI office. The patient did consent to this telephone visit and is aware of possible charges through their insurance for this visit.   The persons participating in this telemedicine service were the patient and I. Time spent on call: 31 minutes  Kelly Norris is a 68 year old female with a past medical history of adenomatous colon polyps, focal colitis in the cecum near the appendiceal orifice seen at colonoscopy in 2018, history of colonic diverticulosis, HTN seen in consult at the request of Mable Paris, FNP to evaluate "colitis" and diarrhea.    The patient reports that she has dealt with some form of "colitis" since she was 68 years old.  She was told initially that she had a "spastic colon".  She reports that she has had some form or fashion of flareups of her colitis since that time.  Her diagnosis was not based on colonoscopy until 2 years ago.  She reports that stress has definitively caused flareups of her colitis type symptoms through the course of her life.  This includes diarrhea and mucus.  About 5 years ago she reports she had a "bug" and was seen by primary care or urgent care with URI type symptoms.  She was given amoxicillin and she developed diarrhea.  She reports since this time she has had different and "strange" bowel pattern.  This seemed to worsen again about 2 years ago and she reports that she will have cycles of diarrhea which can last for 3 to 4 months.  She can have bowel movements 3-6 times per day and have upset crampy abdominal discomfort.  She reports bowel movements range from sludge to watery diarrhea.  All of this occurs despite her consistent diet.  She used to use Imodium but this no longer really seems to help.  In  the past she has had periods of bloody diarrhea.  She has not had much abdominal pain.  Occasionally at night when she lays down she feels abdominal tightening as if a belt were being cinched around her abdomen.  Again her diet has been consistent and she often eats 1/2 cup of cottage cheese and pineapple for lunch after having had a boiled egg for breakfast.  For dinner she usually has a lean meat.  In her 88s and 30s during her colitis flare she would have weight loss but in the last couple years she is in fact gained weight and now weighs more than she is used to weighing.  Of note she has a strong family history of colitis in 2 brothers, a sister, her father and a nephew.  In 2018 she had a colonoscopy performed in [place] by Dr. Gustavo Lah.  This revealed localized moderate inflammation with congestion, erythema and friability in the periappendiceal orifice.  This was biopsied and showed focal moderate active colitis.  The terminal ileum was normal.  A 5 mm a sending colon adenoma was removed with cold snare, a 4 mm distal sigmoid adenoma was removed with snare.  There was a proximal sigmoid polyp which was a benign lymphoid aggregate.  There was diverticulosis in the sigmoid and descending colon.  Biopsies from the colon and random fashion were unremarkable. She had a Prometheus IBD panel which was not suggestive of inflammatory bowel disease.  She is also had previous GI pathogen panel and C. difficile PCR negative.  She recalls being treated with Asacol and this worsened her diarrhea.  She also has a prescription for dicyclomine but does not use it.   Past Medical History:  Diagnosis Date  . Adenomatous colon polyp   . Allergy   . Colitis 2018   at periappendiceal orifice  . Depression   . Diverticulosis   . Endometriosis 1994  . H/O breast biopsy    x 3  . H/O hydronephrosis    left   . Hypertension    2004  . Kidney trouble    previous stent in the left kidney  .  Osteoarthritis    cervical   . Paresthesias    thumb  . Seasonal allergies     Past Surgical History:  Procedure Laterality Date  . ABDOMINAL HYSTERECTOMY  1994   Partial (does not have cervix); endometriosis  . COLONOSCOPY WITH PROPOFOL N/A 12/25/2016   Procedure: COLONOSCOPY WITH PROPOFOL;  Surgeon: Lollie Sails, MD;  Location: [facility] ENDOSCOPY;  Service: Endoscopy;  Laterality: N/A;  . DILATION AND CURETTAGE OF UTERUS  92-94   2 times between 92-94  . URETERAL STENT PLACEMENT      Outpatient Medications Prior to Visit  Medication Sig Dispense Refill  . cyanocobalamin 1000 MCG tablet Take 1,000 mcg by mouth daily.    . fish oil-omega-3 fatty acids 1000 MG capsule Take 1 capsule by mouth daily. Take 1200 mg/day    . Ginkgo Biloba 40 MG TABS Take by mouth.    . hydrochlorothiazide (HYDRODIURIL) 25 MG tablet Take 1 tablet (25 mg total) by mouth daily. 90 tablet 3  . Multiple Vitamins-Minerals (WOMENS 50+ ADVANCED PO) Take 1 tablet by mouth daily.     . Probiotic Product (SOLUBLE FIBER/PROBIOTICS PO) Take by mouth.     No facility-administered medications prior to visit.     Allergies  Allergen Reactions  . Epinephrine Other (See Comments)    Per pt start shaking and mouth clicks tongue gets fat  . Formaldehyde Hives and Other (See Comments)    Made in creams   . Metrogel [Metronidazole] Other (See Comments)  . Other     Multiple antibiotics-patient has colitis and states intolerant to many antibiotics-causes flare ups    Family History  Problem Relation Age of Onset  . Hypertension Mother   . Breast cancer Mother        age 9  . Colon polyps Mother   . Depression Mother   . Colitis Mother   . Hypertension Father   . Prostate cancer Father   . Heart disease Father        bypass surgery in his late 19 's  . Colon polyps Father   . Colitis Father   . Colitis Sister   . Other Sister        stomach polyps  . Colon polyps Brother   . Colitis Brother   . Colitis  Brother   . Valvular heart disease Daughter        valvular heart disease bacterial endocarditis but no premature history  of cardiac  disease  . Valvular heart disease Daughter     Social History   Tobacco Use  . Smoking status: Former Smoker    Years: 20.00    Types: Cigarettes    Quit date: 10/18/2013    Years since quitting: 5.5  . Smokeless tobacco: Never Used  . Tobacco comment: was  smoking about 5 cigarettes daily  Substance Use Topics  . Alcohol use: No  . Drug use: No    ROS: As per history of present illness, otherwise negative  Ht '5\' 1"'$  (1.549 m)   Wt 140 lb (63.5 kg)   BMI 26.45 kg/m  No physical exam, virtual visit  RELEVANT LABS AND IMAGING: CBC    Component Value Date/Time   WBC 7.1 12/07/2018 1556   RBC 4.44 12/07/2018 1556   HGB 14.3 12/07/2018 1556   HGB 14.4 01/09/2016 1124   HCT 41.5 12/07/2018 1556   HCT 41.6 01/09/2016 1124   PLT 311.0 12/07/2018 1556   PLT 287 01/09/2016 1124   MCV 93.4 12/07/2018 1556   MCV 89 01/09/2016 1124   MCH 30.9 01/09/2016 1124   MCHC 34.5 12/07/2018 1556   RDW 13.9 12/07/2018 1556   RDW 13.5 01/09/2016 1124   LYMPHSABS 1.8 12/07/2018 1556   LYMPHSABS 1.9 01/09/2016 1124   MONOABS 0.7 12/07/2018 1556   EOSABS 0.1 12/07/2018 1556   EOSABS 0.2 01/09/2016 1124   BASOSABS 0.0 12/07/2018 1556   BASOSABS 0.0 01/09/2016 1124    CMP     Component Value Date/Time   NA 140 12/27/2018 1445   NA 145 (H) 01/09/2016 1124   K 3.4 (L) 12/27/2018 1445   CL 102 12/27/2018 1445   CO2 30 12/27/2018 1445   GLUCOSE 120 (H) 12/27/2018 1445   BUN 14 12/27/2018 1445   BUN 14 01/09/2016 1124   CREATININE 0.67 12/27/2018 1445   CALCIUM 9.7 12/27/2018 1445   PROT 7.0 12/12/2018 1121   PROT 7.1 01/09/2016 1124   ALBUMIN 4.4 12/12/2018 1121   ALBUMIN 4.7 01/09/2016 1124   AST 17 12/12/2018 1121   ALT 12 12/12/2018 1121   ALKPHOS 118 (H) 12/12/2018 1121   BILITOT 0.4 12/12/2018 1121   BILITOT 0.5 01/09/2016 1124    GFRNONAA 93 01/09/2016 1124   GFRAA 107 01/09/2016 1124    ASSESSMENT/PLAN:  68 year old female with a past medical history of adenomatous colon polyps, focal colitis in the cecum near the appendiceal orifice seen at colonoscopy in 2018, history of colonic diverticulosis, HTN seen in consult at the request of Mable Paris, FNP to evaluate "colitis" and diarrhea.   1.  Colitis/chronic diarrhea with mucus in stool and occasional blood --her symptoms have components of both an irritable bowel type picture but then we also have the diagnosis of "colitis" dating back many years but then active colitis at the cecum near the appendix in 2018.  There was no evidence of ileitis or inflammation outside of the cecum/peri-appendix at that time.  We discussed her symptoms and in order to be able to definitively treat this correctly I think we need more objective evidence which we discussed at length today.  Her family history is also compelling particularly for IBD.  The Prometheus panel did not suggest IBD but I told her that this is not necessarily reliable.  I have recommended the following to start: --CRP and ESR --Fecal calprotectin --Fecal elastase --Celiac panel --Review the labs and stool studies and then will consider repeating the colonoscopy to evaluate for colitis.  Based on all of these objective studies we will decide whether to focus treatment on IBS or IBD.  2.  History of adenomatous colon polyps --subcentimeter adenomas removed in March 2018; surveillance colonoscopy would be March 2023 unless performed sooner for #1.  [id number], Yvetta Coder, Fnp [address]  [place] [address]

## 2019-08-03 ENCOUNTER — Ambulatory Visit (INDEPENDENT_AMBULATORY_CARE_PROVIDER_SITE_OTHER): Payer: Medicare Other | Admitting: Family Medicine

## 2019-08-03 ENCOUNTER — Encounter: Payer: Self-pay | Admitting: Family Medicine

## 2019-08-03 ENCOUNTER — Other Ambulatory Visit: Payer: Self-pay

## 2019-08-03 VITALS — BP 120/70 | HR 98 | Temp 97.5°F | Ht 61.0 in | Wt 136.4 lb

## 2019-08-03 DIAGNOSIS — N1330 Unspecified hydronephrosis: Secondary | ICD-10-CM

## 2019-08-03 DIAGNOSIS — Z23 Encounter for immunization: Secondary | ICD-10-CM

## 2019-08-03 DIAGNOSIS — E876 Hypokalemia: Secondary | ICD-10-CM | POA: Diagnosis not present

## 2019-08-03 DIAGNOSIS — K580 Irritable bowel syndrome with diarrhea: Secondary | ICD-10-CM

## 2019-08-03 LAB — BASIC METABOLIC PANEL
BUN: 17 mg/dL (ref 6–23)
CO2: 31 mEq/L (ref 19–32)
Calcium: 10.1 mg/dL (ref 8.4–10.5)
Chloride: 96 mEq/L (ref 96–112)
Creatinine, Ser: 0.74 mg/dL (ref 0.40–1.20)
GFR: 78.41 mL/min (ref >60.00)
Glucose, Bld: 102 mg/dL — ABNORMAL HIGH (ref 70–99)
Potassium: 2.8 mEq/L — CL (ref 3.5–5.1)
Sodium: 139 mEq/L (ref 135–145)

## 2019-08-03 LAB — CBC
HCT: 44.1 % (ref 36.0–46.0)
Hemoglobin: 15.1 g/dL — ABNORMAL HIGH (ref 12.0–15.0)
MCHC: 34.3 g/dL (ref 30.0–36.0)
MCV: 92.6 fl (ref 78.0–100.0)
Platelets: 295 10*3/uL (ref 150.0–400.0)
RBC: 4.76 Mil/uL (ref 3.87–5.11)
RDW: 14.4 % (ref 11.5–15.5)
WBC: 6.5 10*3/uL (ref 4.0–10.5)

## 2019-08-03 MED ORDER — DICYCLOMINE HCL 20 MG PO TABS: 20.0000 mg | ORAL_TABLET | Freq: Three times a day (TID) | ORAL | 2 refills | Status: DC

## 2019-08-03 MED ORDER — POTASSIUM CHLORIDE CRYS ER 20 MEQ PO TBCR: 20.0000 meq | EXTENDED_RELEASE_TABLET | Freq: Every day | ORAL | 1 refills | Status: DC

## 2019-08-03 NOTE — Progress Notes (Signed)
Subjective:    Patient ID: Kelly Norris, female    DOB: [birth year], 68 y.o.   MRN: [id number]  HPI   Patient presents to clinic for follow-up on her kidney issues.  Patient has a history of hydronephrosis of left kidney.  Had some pain in left flank couple weeks ago, became concerned.  Also had an episode a couple weeks ago where she felt as if she was bloated and full of fluid in ABD and ankles, checked her weight and she was 5 pounds heavier than she usually is.  States she kept her feet elevated and drink some more water, was able to urinate frequently and fluid seem to go down and 5 pounds was gone.  Denies any shortness of breath, chest pain or palpitations.  Patient concerned that something with her kidneys could be getting worse due to having the issue with fluid retention flank pain couple weeks ago.  Patient has struggled with chronic diarrhea and IBS for 40 years.  Patient states she has never been able to get definitive diagnosis of what her problem is.  States she has been told various forms of colitis and or various forms of IBS.  States she is use Imodium and multiple other medicines but nothing seems to help.  Patient Active Problem List   Diagnosis Date Noted  . Leg swelling 01/11/2019  . Staph infection 12/21/2018  . Bronchitis 12/09/2018  . Diarrhea 12/09/2018  . Screening for breast cancer 12/09/2018  . Nausea 12/09/2018  . Annual physical exam 12/09/2018  . Insomnia 01/09/2016  . Hypertension 09/23/2012  . Fatigue 09/23/2012  . History of tobacco abuse 09/23/2012   Social History   Tobacco Use  . Smoking status: Former Smoker    Years: 20.00    Types: Cigarettes    Quit date: 10/18/2013    Years since quitting: 5.7  . Smokeless tobacco: Never Used  . Tobacco comment: was smoking about 5 cigarettes daily  Substance Use Topics  . Alcohol use: No   Review of Systems  Constitutional: Negative for chills, fatigue and fever.  HENT: Negative for  congestion, ear pain, sinus pain and sore throat.   Eyes: Negative.   Respiratory: Negative for cough, shortness of breath and wheezing.   Cardiovascular: Negative for chest pain, palpitations. Gastrointestinal: Negative for abdominal pain, diarrhea, nausea and vomiting.  Genitourinary: Negative for dysuria, frequency and urgency.  Musculoskeletal: Negative for arthralgias and myalgias.  Skin: Negative for color change, pallor and rash.  Neurological: Negative for syncope, light-headedness and headaches.  Psychiatric/Behavioral: The patient is not nervous/anxious.       Objective:   Physical Exam Vitals signs and nursing note reviewed.  Constitutional:      General: She is not in acute distress.    Appearance: She is not ill-appearing, toxic-appearing or diaphoretic.  HENT:     Head: Normocephalic and atraumatic.  Eyes:     General: No scleral icterus.    Extraocular Movements: Extraocular movements intact.     Conjunctiva/sclera: Conjunctivae normal.     Pupils: Pupils are equal, round, and reactive to light.  Neurological:     Mental Status: She is alert.  Psychiatric:        Mood and Affect: Mood normal.        Behavior: Behavior normal.        Thought Content: Thought content normal.        Judgment: Judgment normal.      Today's Vitals   08/03/19 1143  BP: 120/70  Pulse: 98  Temp: (!) 97.5 F (36.4 C)  TempSrc: Temporal  SpO2: 98%  Weight: 136 lb 6.4 oz (61.9 kg)  Height: 5\' 1"  (1.549 m)   Body mass index is 25.77 kg/m.  Assessment & Plan:    A total of 25  minutes were spent face-to-face with the patient during this encounter and over half of that time was spent on counseling and coordination of care. The patient was counseled on strategies for diarrhea treatment, where kidney functions can be   Hydronephosis -we will recheck patient's kidney functions in the lab work.  At this time she will remain on her hydrochlorothiazide.  Does well with her blood  pressure, but we discussed possibility of changing this medication if kidney functions were decreased.  Hypokalemia-lab work does reveal hypokalemia.  We will recheck potassium level and about 5 days.  She will begin potassium supplementation.    Chronic diarrhea/IBS-patient will trial Bentyl to see if this helps improve symptoms.  Long discussion with patient in regards to IBS as an overarching diagnosis after other bowel diseases such as Crohn's disease and ulcerative colitis are ruled out.  Advised there is no specific treatment for IBS, main goal is symptom management.  Patient will otherwise keep all regular follow-ups with PCP as planned.  She will call office anytime with questions or concerns.

## 2019-08-08 ENCOUNTER — Other Ambulatory Visit (INDEPENDENT_AMBULATORY_CARE_PROVIDER_SITE_OTHER): Payer: Medicare Other

## 2019-08-08 ENCOUNTER — Other Ambulatory Visit: Payer: Self-pay

## 2019-08-08 DIAGNOSIS — E876 Hypokalemia: Secondary | ICD-10-CM

## 2019-08-08 LAB — POTASSIUM: Potassium: 2.8 mEq/L — CL (ref 3.5–5.1)

## 2019-08-08 MED ORDER — POTASSIUM CHLORIDE CRYS ER 20 MEQ PO TBCR: 20.0000 meq | EXTENDED_RELEASE_TABLET | Freq: Three times a day (TID) | ORAL | 1 refills | Status: DC

## 2019-08-08 NOTE — Addendum Note (Signed)
Addended by: Philis Nettle on: 08/08/2019 04:19 PM   Modules accepted: Orders

## 2019-08-14 ENCOUNTER — Other Ambulatory Visit (INDEPENDENT_AMBULATORY_CARE_PROVIDER_SITE_OTHER): Payer: Medicare Other

## 2019-08-14 ENCOUNTER — Other Ambulatory Visit: Payer: Self-pay

## 2019-08-14 DIAGNOSIS — E876 Hypokalemia: Secondary | ICD-10-CM | POA: Diagnosis not present

## 2019-08-15 ENCOUNTER — Other Ambulatory Visit: Payer: Self-pay | Admitting: Family Medicine

## 2019-08-15 DIAGNOSIS — E876 Hypokalemia: Secondary | ICD-10-CM

## 2019-08-15 LAB — POTASSIUM: Potassium: 3.5 mmol/L (ref 3.5–5.2)

## 2019-08-15 NOTE — Progress Notes (Signed)
Potassium and kidney functions recheck

## 2019-11-29 ENCOUNTER — Encounter: Payer: Self-pay | Admitting: Family Medicine

## 2019-11-29 ENCOUNTER — Telehealth: Payer: Self-pay | Admitting: Family

## 2019-11-29 ENCOUNTER — Other Ambulatory Visit: Payer: Self-pay

## 2019-11-29 ENCOUNTER — Ambulatory Visit (INDEPENDENT_AMBULATORY_CARE_PROVIDER_SITE_OTHER): Payer: Medicare Other | Admitting: Family Medicine

## 2019-11-29 DIAGNOSIS — R197 Diarrhea, unspecified: Secondary | ICD-10-CM | POA: Diagnosis not present

## 2019-11-29 MED ORDER — AMITRIPTYLINE HCL 25 MG PO TABS: 25.0000 mg | ORAL_TABLET | Freq: Every day | ORAL | 1 refills | Status: DC

## 2019-11-29 NOTE — Telephone Encounter (Signed)
Left message to set up 4 week colitis f/u

## 2019-11-29 NOTE — Progress Notes (Signed)
Virtual Visit via video Note  This visit type was conducted due to national recommendations for restrictions regarding the COVID-19 pandemic (e.g. social distancing).  This format is felt to be most appropriate for this patient at this time.  All issues noted in this document were discussed and addressed.  No physical exam was performed (except for noted visual exam findings with Video Visits).   I connected with Kelly Norris today at  1:45 PM EST by a video enabled telemedicine application and verified that I am speaking with the correct person using two identifiers. Location patient: home Location provider: work Persons participating in the virtual visit: patient, provider  I discussed the limitations, risks, security and privacy concerns of performing an evaluation and management service by telephone and the availability of in person appointments. I also discussed with the patient that there may be a patient responsible charge related to this service. The patient expressed understanding and agreed to proceed.  Reason for visit: follow-up  HPI: Colitis: Patient notes a long history of colitis going back 5 years.  She has been evaluated by GI on several occasions.  Since seeing Dr. Hilarie Fredrickson in [place] last July she has had watery diarrhea daily 6+ times a day.  No blood in her stool.  She does have some lower abdominal discomfort that occurs with the diarrhea episodes.  She has had hypokalemia on several occasions related to her diarrhea.  The only thing that she has found that has been helpful as amitriptyline.   ROS: See pertinent positives and negatives per HPI.  Past Medical History:  Diagnosis Date  . Adenomatous colon polyp   . Allergy   . Colitis 2018   at periappendiceal orifice  . Depression   . Diverticulosis   . Endometriosis 1994  . H/O breast biopsy    x 3  . H/O hydronephrosis    left   . Hypertension    2004  . Kidney trouble    previous stent in the  left kidney  . Osteoarthritis    cervical   . Paresthesias    thumb  . Seasonal allergies     Past Surgical History:  Procedure Laterality Date  . ABDOMINAL HYSTERECTOMY  1994   Partial (does not have cervix); endometriosis  . COLONOSCOPY WITH PROPOFOL N/A 12/25/2016   Procedure: COLONOSCOPY WITH PROPOFOL;  Surgeon: Lollie Sails, MD;  Location: [facility] ENDOSCOPY;  Service: Endoscopy;  Laterality: N/A;  . DILATION AND CURETTAGE OF UTERUS  92-94   2 times between 92-94  . URETERAL STENT PLACEMENT      Family History  Problem Relation Age of Onset  . Hypertension Mother   . Breast cancer Mother        age 67  . Colon polyps Mother   . Depression Mother   . Colitis Mother   . Hypertension Father   . Prostate cancer Father   . Heart disease Father        bypass surgery in his late 62 's  . Colon polyps Father   . Colitis Father   . Colitis Sister   . Other Sister        stomach polyps  . Colon polyps Brother   . Colitis Brother   . Colitis Brother   . Valvular heart disease Daughter        valvular heart disease bacterial endocarditis but no premature history  of cardiac  disease  . Valvular heart disease Daughter     SOCIAL  HX: Former smoker   Current Outpatient Medications:  .  cyanocobalamin 1000 MCG tablet, Take 1,000 mcg by mouth daily., Disp: , Rfl:  .  dicyclomine (BENTYL) 20 MG tablet, Take 1 tablet (20 mg total) by mouth 4 (four) times daily -  before meals and at bedtime., Disp: 120 tablet, Rfl: 2 .  fish oil-omega-3 fatty acids 1000 MG capsule, Take 1 capsule by mouth daily. Take 1200 mg/day, Disp: , Rfl:  .  Ginkgo Biloba 40 MG TABS, Take by mouth., Disp: , Rfl:  .  hydrochlorothiazide (HYDRODIURIL) 25 MG tablet, Take 1 tablet (25 mg total) by mouth daily., Disp: 90 tablet, Rfl: 3 .  Multiple Vitamins-Minerals (WOMENS 50+ ADVANCED PO), Take 1 tablet by mouth daily. , Disp: , Rfl:  .  potassium chloride SA (KLOR-CON) 20 MEQ tablet, Take 1 tablet (20 mEq  total) by mouth 3 (three) times daily., Disp: 21 tablet, Rfl: 1 .  Probiotic Product (SOLUBLE FIBER/PROBIOTICS PO), Take by mouth., Disp: , Rfl:  .  amitriptyline (ELAVIL) 25 MG tablet, Take 1 tablet (25 mg total) by mouth at bedtime., Disp: 30 tablet, Rfl: 1  EXAM:  VITALS per patient if applicable:  GENERAL: alert, oriented, appears well and in no acute distress  HEENT: atraumatic, conjunttiva clear, no obvious abnormalities on inspection of external nose and ears  NECK: normal movements of the head and neck  LUNGS: on inspection no signs of respiratory distress, breathing rate appears normal, no obvious gross SOB, gasping or wheezing  CV: no obvious cyanosis  MS: moves all visible extremities without noticeable abnormality  PSYCH/NEURO: pleasant and cooperative, no obvious depression or anxiety, speech and thought processing grossly intact  ASSESSMENT AND PLAN:  Discussed the following assessment and plan:  Diarrhea Chronic issue potentially related to colitis.  Amitriptyline has been beneficial in the past and we will restart this.  She will monitor for side effects.  Discussed that she really should have lab work completed though she is hesitant to come in given the COVID-19 pandemic.  Her potassium has been quite low in the past.  Discussed taking potassium that she has half a tablet twice a day for the next 3 days.  She will follow-up with me or her PCP in several weeks.   No orders of the defined types were placed in this encounter.   Meds ordered this encounter  Medications  . amitriptyline (ELAVIL) 25 MG tablet    Sig: Take 1 tablet (25 mg total) by mouth at bedtime.    Dispense:  30 tablet    Refill:  1     I discussed the assessment and treatment plan with the patient. The patient was provided an opportunity to ask questions and all were answered. The patient agreed with the plan and demonstrated an understanding of the instructions.   The patient was advised to  call back or seek an in-person evaluation if the symptoms worsen or if the condition fails to improve as anticipated.   Tommi Rumps, MD

## 2019-11-29 NOTE — Assessment & Plan Note (Signed)
Chronic issue potentially related to colitis.  Amitriptyline has been beneficial in the past and we will restart this.  She will monitor for side effects.  Discussed that she really should have lab work completed though she is hesitant to come in given the COVID-19 pandemic.  Her potassium has been quite low in the past.  Discussed taking potassium that she has half a tablet twice a day for the next 3 days.  She will follow-up with me or her PCP in several weeks.

## 2019-12-09 ENCOUNTER — Ambulatory Visit: Payer: Medicare Other | Attending: Internal Medicine

## 2019-12-09 DIAGNOSIS — Z23 Encounter for immunization: Secondary | ICD-10-CM | POA: Insufficient documentation

## 2019-12-09 NOTE — Progress Notes (Signed)
   Covid-19 Vaccination Clinic  Name:  Kelly Norris    MRN: [id number] DOB: 02-Aug-1951  12/09/2019  [name] was observed post Covid-19 immunization for 15 minutes without incidence. She was provided with Vaccine Information Sheet and instruction to access the V-Safe system.   [name] was instructed to call 911 with any severe reactions post vaccine: Marland Kitchen Difficulty breathing  . Swelling of your face and throat  . A fast heartbeat  . A bad rash all over your body  . Dizziness and weakness    Immunizations Administered    Name Date Dose VIS Date Route   Pfizer COVID-19 Vaccine 12/09/2019 12:56 PM 0.3 mL 09/29/2019 Intramuscular   Manufacturer: [place]   Lot: [id number]   [place]: [id number]

## 2020-01-03 ENCOUNTER — Ambulatory Visit: Payer: Medicare Other | Attending: Internal Medicine

## 2020-01-03 DIAGNOSIS — Z23 Encounter for immunization: Secondary | ICD-10-CM

## 2020-01-03 NOTE — Progress Notes (Signed)
   Covid-19 Vaccination Clinic  Name:  Kelly Norris    MRN: [id number] DOB: June 19, 1951  01/03/2020  [name] was observed post Covid-19 immunization for 15 minutes without incident. She was provided with Vaccine Information Sheet and instruction to access the V-Safe system.   [name] was instructed to call 911 with any severe reactions post vaccine: Marland Kitchen Difficulty breathing  . Swelling of face and throat  . A fast heartbeat  . A bad rash all over body  . Dizziness and weakness   Immunizations Administered    Name Date Dose VIS Date Route   Pfizer COVID-19 Vaccine 01/03/2020  1:52 PM 0.3 mL 09/29/2019 Intramuscular   Manufacturer: [place]   Lot: [id number]   [place]: [id number]

## 2020-01-23 ENCOUNTER — Other Ambulatory Visit: Payer: Self-pay

## 2020-01-23 ENCOUNTER — Encounter: Payer: Self-pay | Admitting: Family

## 2020-01-23 ENCOUNTER — Ambulatory Visit (INDEPENDENT_AMBULATORY_CARE_PROVIDER_SITE_OTHER): Payer: Medicare Other | Admitting: Family

## 2020-01-23 VITALS — BP 118/76 | HR 84 | Temp 97.7°F | Ht 61.0 in | Wt 137.6 lb

## 2020-01-23 DIAGNOSIS — R197 Diarrhea, unspecified: Secondary | ICD-10-CM

## 2020-01-23 DIAGNOSIS — F32A Depression, unspecified: Secondary | ICD-10-CM

## 2020-01-23 DIAGNOSIS — I10 Essential (primary) hypertension: Secondary | ICD-10-CM | POA: Diagnosis not present

## 2020-01-23 DIAGNOSIS — F329 Major depressive disorder, single episode, unspecified: Secondary | ICD-10-CM | POA: Diagnosis not present

## 2020-01-23 LAB — BASIC METABOLIC PANEL
BUN: 15 mg/dL (ref 6–23)
CO2: 31 mEq/L (ref 19–32)
Calcium: 10 mg/dL (ref 8.4–10.5)
Chloride: 100 mEq/L (ref 96–112)
Creatinine, Ser: 0.74 mg/dL (ref 0.40–1.20)
GFR: 78.3 mL/min (ref >60.00)
Glucose, Bld: 104 mg/dL — ABNORMAL HIGH (ref 70–99)
Potassium: 3.2 mEq/L — ABNORMAL LOW (ref 3.5–5.1)
Sodium: 140 mEq/L (ref 135–145)

## 2020-01-23 MED ORDER — AMITRIPTYLINE HCL 50 MG PO TABS: 50.0000 mg | ORAL_TABLET | Freq: Every day | ORAL | 1 refills | Status: DC

## 2020-01-23 NOTE — Assessment & Plan Note (Addendum)
Worsened over past year related to isolation, loss of job. Discussed alternative first line medication including zoloft, trazodone which likely more effective. Patient prefers a trial increase of amitriptyline. We will increase to 50mg  and monitor for sedation , anticholinergic side effects ( which may be helpful with diarrhea). Close follow up.

## 2020-01-23 NOTE — Patient Instructions (Signed)
Increase amitriptyline to 50mg  at bedtime Let me know how you are doing

## 2020-01-23 NOTE — Progress Notes (Signed)
Subjective:    Patient ID: Kelly Norris, female    DOB: 07/15/1951, 69 y.o.   MRN: [id number]  CC: Kelly Norris is a 69 y.o. female who presents today for follow up.   HPI: Follow up starting amitriptyline for diarrhea.  She is concerned about depression. No anxiety. Worsened since being laid off June 2020 as had no plans of retirement at this time.  Interviewing and disheartened by recent interview. Has been over sleeping.  Has been on wellbutrin however didn't like medication due to weight gain.   HTN- compliant with HCTZ. No cp, sob.  No KCl  Diarrhea resolved , noted 3 days after covid vaccine. Not sure if incidental or due to undiagnosed covid 19 early 2020.   Not sure how helpful amitriptyline is for diarrhea however stools are formed now.    HISTORY:  Past Medical History:  Diagnosis Date  . Adenomatous colon polyp   . Allergy   . Colitis 2018   at periappendiceal orifice  . Depression   . Diverticulosis   . Endometriosis 1994  . H/O breast biopsy    x 3  . H/O hydronephrosis    left   . Hypertension    2004  . Kidney trouble    previous stent in the left kidney  . Osteoarthritis    cervical   . Paresthesias    thumb  . Seasonal allergies    Past Surgical History:  Procedure Laterality Date  . ABDOMINAL HYSTERECTOMY  1994   Partial (does not have cervix); endometriosis  . COLONOSCOPY WITH PROPOFOL N/A 12/25/2016   Procedure: COLONOSCOPY WITH PROPOFOL;  Surgeon: Lollie Sails, MD;  Location: [facility] ENDOSCOPY;  Service: Endoscopy;  Laterality: N/A;  . DILATION AND CURETTAGE OF UTERUS  92-94   2 times between 92-94  . URETERAL STENT PLACEMENT     Family History  Problem Relation Age of Onset  . Hypertension Mother   . Breast cancer Mother        age 26  . Colon polyps Mother   . Depression Mother   . Colitis Mother   . Hypertension Father   . Prostate cancer Father   . Heart disease Father        bypass surgery in his late 98 's    . Colon polyps Father   . Colitis Father   . Colitis Sister   . Other Sister        stomach polyps  . Colon polyps Brother   . Colitis Brother   . Colitis Brother   . Valvular heart disease Daughter        valvular heart disease bacterial endocarditis but no premature history  of cardiac  disease  . Valvular heart disease Daughter     Allergies: Epinephrine, Formaldehyde, Metrogel [metronidazole], and Other Current Outpatient Medications on File Prior to Visit  Medication Sig Dispense Refill  . cyanocobalamin 1000 MCG tablet Take 1,000 mcg by mouth daily.    . fish oil-omega-3 fatty acids 1000 MG capsule Take 1 capsule by mouth daily. Take 1200 mg/day    . Ginkgo Biloba 40 MG TABS Take by mouth.    . hydrochlorothiazide (HYDRODIURIL) 25 MG tablet Take 1 tablet (25 mg total) by mouth daily. 90 tablet 3  . Multiple Vitamins-Minerals (WOMENS 50+ ADVANCED PO) Take 1 tablet by mouth daily.     . Probiotic Product (SOLUBLE FIBER/PROBIOTICS PO) Take by mouth.     No current facility-administered medications on file prior to  visit.    Social History   Tobacco Use  . Smoking status: Former Smoker    Years: 20.00    Types: Cigarettes    Quit date: 10/18/2013    Years since quitting: 6.2  . Smokeless tobacco: Never Used  . Tobacco comment: was smoking about 5 cigarettes daily  Substance Use Topics  . Alcohol use: No  . Drug use: No    Review of Systems  Constitutional: Negative for chills and fever.  Respiratory: Negative for cough.   Cardiovascular: Negative for chest pain and palpitations.  Gastrointestinal: Negative for constipation, diarrhea (resolved), nausea and vomiting.  Psychiatric/Behavioral: Positive for sleep disturbance. Negative for suicidal ideas. The patient is not nervous/anxious.       Objective:    BP 118/76   Pulse 84   Temp 97.7 F (36.5 C) (Temporal)   Ht 5\' 1"  (1.549 m)   Wt 137 lb 9.6 oz (62.4 kg)   SpO2 97%   BMI 26.00 kg/m  BP Readings from  Last 3 Encounters:  01/23/20 118/76  08/03/19 120/70  03/20/19 118/64   Wt Readings from Last 3 Encounters:  01/23/20 137 lb 9.6 oz (62.4 kg)  11/29/19 130 lb (59 kg)  08/03/19 136 lb 6.4 oz (61.9 kg)    Physical Exam Vitals reviewed.  Constitutional:      Appearance: She is well-developed.  Eyes:     Conjunctiva/sclera: Conjunctivae normal.  Cardiovascular:     Rate and Rhythm: Normal rate and regular rhythm.     Pulses: Normal pulses.     Heart sounds: Normal heart sounds.  Pulmonary:     Effort: Pulmonary effort is normal.     Breath sounds: Normal breath sounds. No wheezing, rhonchi or rales.  Skin:    General: Skin is warm and dry.  Neurological:     Mental Status: She is alert.  Psychiatric:        Speech: Speech normal.        Behavior: Behavior normal.        Thought Content: Thought content normal.        Assessment & Plan:   Problem List Items Addressed This Visit      Cardiovascular and Mediastinum   Hypertension - Primary    Controlled. Continue hctz, pending BMP      Relevant Orders   Basic metabolic panel     Other   Depression    Worsened over past year related to isolation, loss of job. Discussed alternative first line medication including zoloft, trazodone which likely more effective. Patient prefers a trial increase of amitriptyline. We will increase to 50mg  and monitor for sedation , anticholinergic side effects ( which may be helpful with diarrhea). Close follow up.      Relevant Medications   amitriptyline (ELAVIL) 50 MG tablet   Diarrhea    Resolved. Will continue amitriptyline for now.           I have discontinued Kelly Norris's dicyclomine and potassium chloride SA. I have also changed her amitriptyline. Additionally, I am having her maintain her fish oil-omega-3 fatty acids, Multiple Vitamins-Minerals (WOMENS 50+ ADVANCED PO), Ginkgo Biloba, Probiotic Product (SOLUBLE FIBER/PROBIOTICS PO), cyanocobalamin, and  hydrochlorothiazide.   Meds ordered this encounter  Medications  . amitriptyline (ELAVIL) 50 MG tablet    Sig: Take 1 tablet (50 mg total) by mouth at bedtime.    Dispense:  90 tablet    Refill:  1    Order Specific Question:   Supervising  Provider    Answer:   Crecencio Mc [[id number]]    Return precautions given.   Risks, benefits, and alternatives of the medications and treatment plan prescribed today were discussed, and patient expressed understanding.   Education regarding symptom management and diagnosis given to patient on AVS.  Continue to follow with Burnard Hawthorne, FNP for routine health maintenance.   Luna Fuse and I agreed with plan.   Mable Paris, FNP

## 2020-01-23 NOTE — Assessment & Plan Note (Signed)
Controlled. Continue hctz, pending BMP

## 2020-01-23 NOTE — Assessment & Plan Note (Signed)
Resolved. Will continue amitriptyline for now.

## 2020-01-24 ENCOUNTER — Other Ambulatory Visit: Payer: Self-pay | Admitting: Family

## 2020-01-24 DIAGNOSIS — I10 Essential (primary) hypertension: Secondary | ICD-10-CM

## 2020-01-24 MED ORDER — POTASSIUM CHLORIDE ER 10 MEQ PO TBCR: 10.0000 meq | EXTENDED_RELEASE_TABLET | ORAL | 1 refills | Status: DC

## 2020-01-29 ENCOUNTER — Other Ambulatory Visit: Payer: Self-pay | Admitting: Family Medicine

## 2020-02-26 ENCOUNTER — Other Ambulatory Visit (INDEPENDENT_AMBULATORY_CARE_PROVIDER_SITE_OTHER): Payer: Medicare Other

## 2020-02-26 ENCOUNTER — Other Ambulatory Visit: Payer: Self-pay

## 2020-02-26 DIAGNOSIS — Z0000 Encounter for general adult medical examination without abnormal findings: Secondary | ICD-10-CM | POA: Diagnosis not present

## 2020-02-27 ENCOUNTER — Telehealth: Payer: Self-pay | Admitting: Family

## 2020-02-27 DIAGNOSIS — I10 Essential (primary) hypertension: Secondary | ICD-10-CM

## 2020-02-27 LAB — COMPREHENSIVE METABOLIC PANEL
ALT: 14 U/L (ref 0–35)
AST: 17 U/L (ref 0–37)
Albumin: 4.5 g/dL (ref 3.5–5.2)
Alkaline Phosphatase: 94 U/L (ref 39–117)
BUN: 13 mg/dL (ref 6–23)
CO2: 33 mEq/L — ABNORMAL HIGH (ref 19–32)
Calcium: 10.2 mg/dL (ref 8.4–10.5)
Chloride: 97 mEq/L (ref 96–112)
Creatinine, Ser: 0.76 mg/dL (ref 0.40–1.20)
GFR: 75.9 mL/min (ref >60.00)
Glucose, Bld: 99 mg/dL (ref 70–99)
Potassium: 2.9 mEq/L — ABNORMAL LOW (ref 3.5–5.1)
Sodium: 138 mEq/L (ref 135–145)
Total Bilirubin: 0.7 mg/dL (ref 0.2–1.2)
Total Protein: 7.3 g/dL (ref 6.0–8.3)

## 2020-02-27 MED ORDER — HYDROCHLOROTHIAZIDE 12.5 MG PO CAPS: 12.5000 mg | ORAL_CAPSULE | Freq: Every day | ORAL | 0 refills | Status: DC

## 2020-02-27 NOTE — Telephone Encounter (Signed)
Discussed low potassium D/c hctz 25mg  , start 12.5mg   Had 3 days last week, had diarrhea and didn't take klc at that time. Resolved.   Advised potassium 30meq BID x 3 days, then resume to every other day 46meq KCl.   Start losartan 50mg  QD. Monitor blood pressure  Advised to call and schedule repeat BMP in one week.   Any CP, palpitations she understands to go to ED. She verbalized understanding of all.

## 2020-03-06 ENCOUNTER — Ambulatory Visit (INDEPENDENT_AMBULATORY_CARE_PROVIDER_SITE_OTHER): Payer: Medicare Other | Admitting: Family

## 2020-03-06 ENCOUNTER — Other Ambulatory Visit: Payer: Self-pay

## 2020-03-06 ENCOUNTER — Encounter: Payer: Self-pay | Admitting: Family

## 2020-03-06 VITALS — BP 130/94 | HR 78 | Temp 97.6°F | Resp 14 | Ht 61.0 in | Wt 137.2 lb

## 2020-03-06 DIAGNOSIS — I10 Essential (primary) hypertension: Secondary | ICD-10-CM | POA: Diagnosis not present

## 2020-03-06 MED ORDER — LOSARTAN POTASSIUM 50 MG PO TABS: 50.0000 mg | ORAL_TABLET | Freq: Every day | ORAL | 3 refills | Status: DC

## 2020-03-06 NOTE — Patient Instructions (Signed)
Start losartan 50mg  once per day Stay on hctz 12.5mg   It is imperative that you are seen AT least twice per year for labs and monitoring. Monitor blood pressure at home and me 5-6 reading on separate days. Goal is less than 120/80, based on newest guidelines, however we certainly want to be less than 130/80;  if persistently higher, please make sooner follow up appointment so we can recheck you blood pressure and manage/ adjust medications.  Labs in ONE week; these are fasting  Please call  and schedule your 3D mammogram, bone density scan as discussed at [place]. Let me know if any issues in doing so.

## 2020-03-06 NOTE — Assessment & Plan Note (Addendum)
Slightly increased today. Will adjunct hctz with losartan. Advised to stop KCl. She will continue

## 2020-03-06 NOTE — Progress Notes (Signed)
Subjective:    Patient ID: Kelly Norris, female    DOB: 1950/12/15, 69 y.o.   MRN: [id number]  CC: Kelly Norris is a 69 y.o. female who presents today for follow up.   HPI: Follow up blood pressure Feels well today No new complaints.   HTN - Compliant with hctz 12.5mg  and kcl qod. At home 141/80s. Denies exertional chest pain or pressure, numbness or tingling radiating to left arm or jaw, palpitations, dizziness, frequent headaches, changes in vision, or shortness of breath.   Due dexa, mm, fasting labs.         HISTORY:  Past Medical History:  Diagnosis Date  . Adenomatous colon polyp   . Allergy   . Colitis 2018   at periappendiceal orifice  . Depression   . Diverticulosis   . Endometriosis 1994  . H/O breast biopsy    x 3  . H/O hydronephrosis    left   . Hypertension    2004  . Kidney trouble    previous stent in the left kidney  . Osteoarthritis    cervical   . Paresthesias    thumb  . Seasonal allergies    Past Surgical History:  Procedure Laterality Date  . ABDOMINAL HYSTERECTOMY  1994   Partial (does not have cervix); endometriosis  . COLONOSCOPY WITH PROPOFOL N/A 12/25/2016   Procedure: COLONOSCOPY WITH PROPOFOL;  Surgeon: Lollie Sails, MD;  Location: [facility] ENDOSCOPY;  Service: Endoscopy;  Laterality: N/A;  . DILATION AND CURETTAGE OF UTERUS  92-94   2 times between 92-94  . URETERAL STENT PLACEMENT     Family History  Problem Relation Age of Onset  . Hypertension Mother   . Breast cancer Mother        age 45  . Colon polyps Mother   . Depression Mother   . Colitis Mother   . Hypertension Father   . Prostate cancer Father   . Heart disease Father        bypass surgery in his late 21 's  . Colon polyps Father   . Colitis Father   . Colitis Sister   . Other Sister        stomach polyps  . Colon polyps Brother   . Colitis Brother   . Colitis Brother   . Valvular heart disease Daughter        valvular heart disease  bacterial endocarditis but no premature history  of cardiac  disease  . Valvular heart disease Daughter     Allergies: Epinephrine, Formaldehyde, Metrogel [metronidazole], and Other Current Outpatient Medications on File Prior to Visit  Medication Sig Dispense Refill  . amitriptyline (ELAVIL) 50 MG tablet Take 1 tablet (50 mg total) by mouth at bedtime. 90 tablet 1  . cyanocobalamin 1000 MCG tablet Take 1,000 mcg by mouth daily.    . fish oil-omega-3 fatty acids 1000 MG capsule Take 1 capsule by mouth daily. Take 1200 mg/day    . Ginkgo Biloba 40 MG TABS Take by mouth.    . hydrochlorothiazide (MICROZIDE) 12.5 MG capsule Take 1 capsule (12.5 mg total) by mouth daily. 90 capsule 0  . Multiple Vitamins-Minerals (WOMENS 50+ ADVANCED PO) Take 1 tablet by mouth daily.     . potassium chloride (KLOR-CON) 10 MEQ tablet Take 1 tablet (10 mEq total) by mouth every other day. 60 tablet 1  . Probiotic Product (SOLUBLE FIBER/PROBIOTICS PO) Take by mouth.     No current facility-administered medications on file prior to  visit.    Social History   Tobacco Use  . Smoking status: Former Smoker    Years: 20.00    Types: Cigarettes    Quit date: 10/18/2013    Years since quitting: 6.3  . Smokeless tobacco: Never Used  . Tobacco comment: was smoking about 5 cigarettes daily  Substance Use Topics  . Alcohol use: No  . Drug use: No    Review of Systems  Constitutional: Negative for chills and fever.  Respiratory: Negative for cough.   Cardiovascular: Negative for chest pain and palpitations.  Gastrointestinal: Negative for nausea and vomiting.      Objective:    BP (!) 130/94 (BP Location: Left Arm, Patient Position: Sitting, Cuff Size: Normal)   Pulse 78   Temp 97.6 F (36.4 C) (Temporal)   Resp 14   Ht 5\' 1"  (1.549 m)   Wt 137 lb 3.2 oz (62.2 kg)   SpO2 97%   BMI 25.92 kg/m  BP Readings from Last 3 Encounters:  03/06/20 (!) 130/94  01/23/20 118/76  08/03/19 120/70   Wt Readings  from Last 3 Encounters:  03/06/20 137 lb 3.2 oz (62.2 kg)  01/23/20 137 lb 9.6 oz (62.4 kg)  11/29/19 130 lb (59 kg)    Physical Exam Vitals reviewed.  Constitutional:      Appearance: She is well-developed.  Eyes:     Conjunctiva/sclera: Conjunctivae normal.  Cardiovascular:     Rate and Rhythm: Normal rate and regular rhythm.     Pulses: Normal pulses.     Heart sounds: Normal heart sounds.  Pulmonary:     Effort: Pulmonary effort is normal.     Breath sounds: Normal breath sounds. No wheezing, rhonchi or rales.  Skin:    General: Skin is warm and dry.  Neurological:     Mental Status: She is alert.  Psychiatric:        Speech: Speech normal.        Behavior: Behavior normal.        Thought Content: Thought content normal.        Assessment & Plan:   Problem List Items Addressed This Visit      Cardiovascular and Mediastinum   Hypertension - Primary    Slightly increased today. Will adjunct hctz with losartan. Advised to stop KCl. She will continue       Relevant Medications   losartan (COZAAR) 50 MG tablet   Other Relevant Orders   Comprehensive metabolic panel   CBC with Differential/Platelet   Hemoglobin A1c   Hepatitis C antibody   Lipid panel   TSH   VITAMIN D 25 Hydroxy (Vit-D Deficiency, Fractures)   MM 3D SCREEN BREAST BILATERAL   DG Bone Density     Of note, patient will schedule dexa, mammogram.  I am having Haynes Dage Taylor-Hicks start on losartan. I am also having her maintain her fish oil-omega-3 fatty acids, Multiple Vitamins-Minerals (WOMENS 50+ ADVANCED PO), Ginkgo Biloba, Probiotic Product (SOLUBLE FIBER/PROBIOTICS PO), cyanocobalamin, amitriptyline, potassium chloride, and hydrochlorothiazide.   Meds ordered this encounter  Medications  . losartan (COZAAR) 50 MG tablet    Sig: Take 1 tablet (50 mg total) by mouth daily.    Dispense:  90 tablet    Refill:  3    Order Specific Question:   Supervising Provider    Answer:   Crecencio Mc [[id number]]    Return precautions given.   Risks, benefits, and alternatives of the medications and treatment plan prescribed today  were discussed, and patient expressed understanding.   Education regarding symptom management and diagnosis given to patient on AVS.  Continue to follow with Burnard Hawthorne, FNP for routine health maintenance.   Luna Fuse and I agreed with plan.   Mable Paris, FNP

## 2020-03-14 ENCOUNTER — Other Ambulatory Visit: Payer: Medicare Other

## 2020-05-15 ENCOUNTER — Ambulatory Visit (INDEPENDENT_AMBULATORY_CARE_PROVIDER_SITE_OTHER): Payer: Medicare Other

## 2020-05-15 VITALS — Ht 61.0 in | Wt 137.0 lb

## 2020-05-15 DIAGNOSIS — Z0000 Encounter for general adult medical examination without abnormal findings: Secondary | ICD-10-CM

## 2020-05-15 NOTE — Patient Instructions (Addendum)
Kelly Norris , Thank you for taking time to come for your Medicare Wellness Visit. I appreciate your ongoing commitment to your health goals. Please review the following plan we discussed and let me know if I can assist you in the future.   These are the goals we discussed: Goals      Patient Stated   .  I would like to start going to the gym for exercise (pt-stated)       This is a list of the screening recommended for you and due dates:  Health Maintenance  Topic Date Due  .  Hepatitis C: One time screening is recommended by Center for Disease Control  (CDC) for  adults born from [birth year] through 1963.   Never done  . Mammogram  12/13/2009  . DEXA scan (bone density measurement)  Never done  . Flu Shot  05/19/2020  . Colon Cancer Screening  12/25/2021  . Tetanus Vaccine  03/02/2028  . COVID-19 Vaccine  Completed  . Pneumonia vaccines  Completed     Immunizations Immunization History  Administered Date(s) Administered  . Fluad Quad(high Dose 67+) 08/03/2019  . Influenza, High Dose Seasonal PF 08/10/2018  . Influenza,inj,Quad PF,6+ Mos 07/10/2016  . PFIZER SARS-COV-2 Vaccination 12/09/2019, 01/03/2020  . Pneumococcal Conjugate-13 03/02/2018  . Pneumococcal Polysaccharide-23 03/20/2019  . Tdap 03/02/2018  . Zoster 07/10/2016    Keep all routine maintenance appointments.   Follow up 06/05/20 @ 11:00  Advanced directives: May ask PMD at upcoming visit for additional educational material.   Conditions/risks identified: none new.  Follow up in one year for your annual wellness visit.   Preventive Care 67 Years and Older, Female Preventive care refers to lifestyle choices and visits with your health care provider that can promote health and wellness. What does preventive care include?  A yearly physical exam. This is also called an annual well check.  Dental exams once or twice a year.  Routine eye exams. Ask your health care provider how often you should have your  eyes checked.  Personal lifestyle choices, including:  Daily care of your teeth and gums.  Regular physical activity.  Eating a healthy diet.  Avoiding tobacco and drug use.  Limiting alcohol use.  Practicing safe sex.  Taking low-dose aspirin every day.  Taking vitamin and mineral supplements as recommended by your health care provider. What happens during an annual well check? The services and screenings done by your health care provider during your annual well check will depend on your age, overall health, lifestyle risk factors, and family history of disease. Counseling  Your health care provider may ask you questions about your:  Alcohol use.  Tobacco use.  Drug use.  Emotional well-being.  Home and relationship well-being.  Sexual activity.  Eating habits.  History of falls.  Memory and ability to understand (cognition).  Work and work Statistician.  Reproductive health. Screening  You may have the following tests or measurements:  Height, weight, and BMI.  Blood pressure.  Lipid and cholesterol levels. These may be checked every 5 years, or more frequently if you are over 63 years old.  Skin check.  Lung cancer screening. You may have this screening every year starting at age 30 if you have a 30-pack-year history of smoking and currently smoke or have quit within the past 15 years.  Fecal occult blood test (FOBT) of the stool. You may have this test every year starting at age 45.  Flexible sigmoidoscopy or colonoscopy. You may  have a sigmoidoscopy every 5 years or a colonoscopy every 10 years starting at age 60.  Hepatitis C blood test.  Hepatitis B blood test.  Sexually transmitted disease (STD) testing.  Diabetes screening. This is done by checking your blood sugar (glucose) after you have not eaten for a while (fasting). You may have this done every 1-3 years.  Bone density scan. This is done to screen for osteoporosis. You may have this  done starting at age 76.  Mammogram. This may be done every 1-2 years. Talk to your health care provider about how often you should have regular mammograms. Talk with your health care provider about your test results, treatment options, and if necessary, the need for more tests. Vaccines  Your health care provider may recommend certain vaccines, such as:  Influenza vaccine. This is recommended every year.  Tetanus, diphtheria, and acellular pertussis (Tdap, Td) vaccine. You may need a Td booster every 10 years.  Zoster vaccine. You may need this after age 20.  Pneumococcal 13-valent conjugate (PCV13) vaccine. One dose is recommended after age 64.  Pneumococcal polysaccharide (PPSV23) vaccine. One dose is recommended after age 70. Talk to your health care provider about which screenings and vaccines you need and how often you need them. This information is not intended to replace advice given to you by your health care provider. Make sure you discuss any questions you have with your health care provider. Document Released: 11/01/2015 Document Revised: 06/24/2016 Document Reviewed: 08/06/2015 Elsevier Interactive Patient Education  2017 [place] Prevention in the Home Falls can cause injuries. They can happen to people of all ages. There are many things you can do to make your home safe and to help prevent falls. What can I do on the outside of my home?  Regularly fix the edges of walkways and driveways and fix any cracks.  Remove anything that might make you trip as you walk through a door, such as a raised step or threshold.  Trim any bushes or trees on the path to your home.  Use bright outdoor lighting.  Clear any walking paths of anything that might make someone trip, such as rocks or tools.  Regularly check to see if handrails are loose or broken. Make sure that both sides of any steps have handrails.  Any raised decks and porches should have guardrails on the  edges.  Have any leaves, snow, or ice cleared regularly.  Use sand or salt on walking paths during winter.  Clean up any spills in your garage right away. This includes oil or grease spills. What can I do in the bathroom?  Use night lights.  Install grab bars by the toilet and in the tub and shower. Do not use towel bars as grab bars.  Use non-skid mats or decals in the tub or shower.  If you need to sit down in the shower, use a plastic, non-slip stool.  Keep the floor dry. Clean up any water that spills on the floor as soon as it happens.  Remove soap buildup in the tub or shower regularly.  Attach bath mats securely with double-sided non-slip rug tape.  Do not have throw rugs and other things on the floor that can make you trip. What can I do in the bedroom?  Use night lights.  Make sure that you have a light by your bed that is easy to reach.  Do not use any sheets or blankets that are too big for your  bed. They should not hang down onto the floor.  Have a firm chair that has side arms. You can use this for support while you get dressed.  Do not have throw rugs and other things on the floor that can make you trip. What can I do in the kitchen?  Clean up any spills right away.  Avoid walking on wet floors.  Keep items that you use a lot in easy-to-reach places.  If you need to reach something above you, use a strong step stool that has a grab bar.  Keep electrical cords out of the way.  Do not use floor polish or wax that makes floors slippery. If you must use wax, use non-skid floor wax.  Do not have throw rugs and other things on the floor that can make you trip. What can I do with my stairs?  Do not leave any items on the stairs.  Make sure that there are handrails on both sides of the stairs and use them. Fix handrails that are broken or loose. Make sure that handrails are as long as the stairways.  Check any carpeting to make sure that it is firmly  attached to the stairs. Fix any carpet that is loose or worn.  Avoid having throw rugs at the top or bottom of the stairs. If you do have throw rugs, attach them to the floor with carpet tape.  Make sure that you have a light switch at the top of the stairs and the bottom of the stairs. If you do not have them, ask someone to add them for you. What else can I do to help prevent falls?  Wear shoes that:  Do not have high heels.  Have rubber bottoms.  Are comfortable and fit you well.  Are closed at the toe. Do not wear sandals.  If you use a stepladder:  Make sure that it is fully opened. Do not climb a closed stepladder.  Make sure that both sides of the stepladder are locked into place.  Ask someone to hold it for you, if possible.  Clearly mark and make sure that you can see:  Any grab bars or handrails.  First and last steps.  Where the edge of each step is.  Use tools that help you move around (mobility aids) if they are needed. These include:  Canes.  Walkers.  Scooters.  Crutches.  Turn on the lights when you go into a dark area. Replace any light bulbs as soon as they burn out.  Set up your furniture so you have a clear path. Avoid moving your furniture around.  If any of your floors are uneven, fix them.  If there are any pets around you, be aware of where they are.  Review your medicines with your doctor. Some medicines can make you feel dizzy. This can increase your chance of falling. Ask your doctor what other things that you can do to help prevent falls. This information is not intended to replace advice given to you by your health care provider. Make sure you discuss any questions you have with your health care provider. Document Released: 08/01/2009 Document Revised: 03/12/2016 Document Reviewed: 11/09/2014 Elsevier Interactive Patient Education  2017 [employer].

## 2020-05-15 NOTE — Progress Notes (Addendum)
Subjective:   Karmina Zufall is a 69 y.o. female who presents for an Initial Medicare Annual Wellness Visit.  Review of Systems    No ROS.  Medicare Wellness Virtual Visit.   Cardiac Risk Factors include: advanced age (>17men, >80 women);hypertension     Objective:    Today's Vitals   05/15/20 0842  Weight: 137 lb (62.1 kg)  Height: 5\' 1"  (1.549 m)   Body mass index is 25.89 kg/m.  Advanced Directives 05/15/2020 12/25/2016 05/09/2015  Does Patient Have a Medical Advance Directive? No No No  Would patient like information on creating a medical advance directive? No - Patient declined - No - patient declined information    Current Medications (verified) Outpatient Encounter Medications as of 05/15/2020  Medication Sig  . amitriptyline (ELAVIL) 50 MG tablet Take 1 tablet (50 mg total) by mouth at bedtime.  . cyanocobalamin 1000 MCG tablet Take 1,000 mcg by mouth daily.  . fish oil-omega-3 fatty acids 1000 MG capsule Take 1 capsule by mouth daily. Take 1200 mg/day  . Ginkgo Biloba 40 MG TABS Take by mouth.  . hydrochlorothiazide (MICROZIDE) 12.5 MG capsule Take 1 capsule (12.5 mg total) by mouth daily.  Marland Kitchen losartan (COZAAR) 50 MG tablet Take 1 tablet (50 mg total) by mouth daily.  . Multiple Vitamins-Minerals (WOMENS 50+ ADVANCED PO) Take 1 tablet by mouth daily.   . potassium chloride (KLOR-CON) 10 MEQ tablet Take 1 tablet (10 mEq total) by mouth every other day.  . Probiotic Product (SOLUBLE FIBER/PROBIOTICS PO) Take by mouth.   No facility-administered encounter medications on file as of 05/15/2020.    Allergies (verified) Epinephrine, Formaldehyde, Metrogel [metronidazole], and Other   History: Past Medical History:  Diagnosis Date  . Adenomatous colon polyp   . Allergy   . Colitis 2018   at periappendiceal orifice  . Depression   . Diverticulosis   . Endometriosis 1994  . H/O breast biopsy    x 3  . H/O hydronephrosis    left   . Hypertension    2004  .  Kidney trouble    previous stent in the left kidney  . Osteoarthritis    cervical   . Paresthesias    thumb  . Seasonal allergies    Past Surgical History:  Procedure Laterality Date  . ABDOMINAL HYSTERECTOMY  1994   Partial (does not have cervix); endometriosis  . COLONOSCOPY WITH PROPOFOL N/A 12/25/2016   Procedure: COLONOSCOPY WITH PROPOFOL;  Surgeon: Lollie Sails, MD;  Location: [facility] ENDOSCOPY;  Service: Endoscopy;  Laterality: N/A;  . DILATION AND CURETTAGE OF UTERUS  92-94   2 times between 92-94  . URETERAL STENT PLACEMENT     Family History  Problem Relation Age of Onset  . Hypertension Mother   . Breast cancer Mother        age 42  . Colon polyps Mother   . Depression Mother   . Colitis Mother   . Hypertension Father   . Prostate cancer Father   . Heart disease Father        bypass surgery in his late 2 's  . Colon polyps Father   . Colitis Father   . Colitis Sister   . Other Sister        stomach polyps  . Colon polyps Brother   . Colitis Brother   . Colitis Brother   . Valvular heart disease Daughter        valvular heart disease bacterial endocarditis but  no premature history  of cardiac  disease  . Valvular heart disease Daughter    Social History   Socioeconomic History  . Marital status: Divorced    Spouse name: Not on file  . Number of children: 2  . Years of education: 16  . Highest education level: Bachelor's degree (e.g., BA, AB, BS)  Occupational History    Employer: [employer]  Tobacco Use  . Smoking status: Former Smoker    Years: 20.00    Types: Cigarettes    Quit date: 10/18/2013    Years since quitting: 6.5  . Smokeless tobacco: Never Used  . Tobacco comment: was smoking about 5 cigarettes daily  Vaping Use  . Vaping Use: Never used  Substance and Sexual Activity  . Alcohol use: No  . Drug use: No  . Sexual activity: Not on file  Other Topics Concern  . Not on file  Social History Narrative         Social  Determinants of Health   Financial Resource Strain: Low Risk   . Difficulty of Paying Living Expenses: Not hard at all  Food Insecurity:   . Worried About Charity fundraiser in the Last Year:   . Arboriculturist in the Last Year:   Transportation Needs: No Transportation Needs  . Lack of Transportation (Medical): No  . Lack of Transportation (Non-Medical): No  Physical Activity:   . Days of Exercise per Week:   . Minutes of Exercise per Session:   Stress:   . Feeling of Stress :   Social Connections:   . Frequency of Communication with Friends and Family:   . Frequency of Social Gatherings with Friends and Family:   . Attends Religious Services:   . Active Member of Clubs or Organizations:   . Attends Archivist Meetings:   Marland Kitchen Marital Status:     Tobacco Counseling Counseling given: Not Answered Comment: was smoking about 5 cigarettes daily   Clinical Intake:           Diabetes: No  How often do you need to have someone help you when you read instructions, pamphlets, or other written materials from your doctor or pharmacy?: 1 - Never  Interpreter Needed?: No      Activities of Daily Living In your present state of health, do you have any difficulty performing the following activities: 05/15/2020 01/23/2020  Hearing? N N  Vision? N N  Difficulty concentrating or making decisions? N N  Walking or climbing stairs? N N  Dressing or bathing? N N  Doing errands, shopping? N N  Preparing Food and eating ? N -  Using the Toilet? N -  In the past six months, have you accidently leaked urine? N -  Do you have problems with loss of bowel control? N -  Comment Chronic diarrhea. Followed by GI -  Managing your Medications? N -  Managing your Finances? N -  Housekeeping or managing your Housekeeping? N -  Some recent data might be hidden    Patient Care Team: Burnard Hawthorne, FNP as PCP - General (Family Medicine)  Indicate any recent Medical Services  you may have received from other than [facility] providers in the past year (date may be approximate).     Assessment:   This is a routine wellness examination for Rubbie.  I connected with Zyaire today by telephone and verified that I am speaking with the correct person using two identifiers. Location patient:  home Location provider: work Persons participating in the virtual visit: patient, nurse.    I discussed the limitations, risks, security and privacy concerns of performing an evaluation and management service by telephone and the availability of in person appointments. The patient expressed understanding and verbally consented to this telephonic visit.    Interactive audio and video telecommunications were attempted between this provider and patient, however failed, due to patient having technical difficulties OR patient did not have access to video capability.  We continued and completed visit with audio only.  Some vital signs may be absent or patient reported.   Hearing/Vision screen  Hearing Screening   125Hz  250Hz  500Hz  1000Hz  2000Hz  3000Hz  4000Hz  6000Hz  8000Hz   Right ear:           Left ear:           Comments: Patient is able to hear conversational tones without difficulty.  No issues reported.  Vision Screening Comments: Followed by Dr. Laurence Aly Wears corrective lenses Visual acuity not assessed, virtual visit.  They have seen their ophthalmologist in the last 12 months.     Dietary issues and exercise activities discussed: Current Exercise Habits: Home exercise routine, Intensity: Mild  Goals      Patient Stated   .  I would like to start going to the gym for exercise (pt-stated)      Depression Screen PHQ 2/9 Scores 05/15/2020 01/23/2020 11/29/2019 08/03/2019 01/24/2018 07/10/2016  PHQ - 2 Score 0 2 0 0 1 1  PHQ- 9 Score - 5 - - - -    Fall Risk Fall Risk  05/15/2020 03/06/2020 11/29/2019 08/03/2019 01/24/2018  Falls in the past year? 0 0 0 0 No  Number falls in  past yr: 0 - 0 0 -  Follow up Falls evaluation completed Falls evaluation completed Falls evaluation completed Falls evaluation completed -   Handrails in use when climbing stairs? Yes  Home free of loose throw rugs in walkways, pet beds, electrical cords, etc? Yes  Adequate lighting in your home to reduce risk of falls? Yes   ASSISTIVE DEVICES UTILIZED TO PREVENT FALLS:  Life alert? No  Use of a cane, walker or w/c? No  Grab bars in the bathroom? No  Shower chair or bench in shower? No  Elevated toilet seat or a handicapped toilet? No   TIMED UP AND GO:  Was the test performed? No . Virtual visit.   Cognitive Function: MMSE - Mini Mental State Exam 05/15/2020  Not completed: Unable to complete  Patient is alert and oriented x3.  Patients worked as an Optometrist.  Denies difficulty focusing, making decisions, memory loss.       Immunizations Immunization History  Administered Date(s) Administered  . Fluad Quad(high Dose 65+) 08/03/2019  . Influenza, High Dose Seasonal PF 08/10/2018  . Influenza,inj,Quad PF,6+ Mos 07/10/2016  . PFIZER SARS-COV-2 Vaccination 12/09/2019, 01/03/2020  . Pneumococcal Conjugate-13 03/02/2018  . Pneumococcal Polysaccharide-23 03/20/2019  . Tdap 03/02/2018  . Zoster 07/10/2016   Health Maintenance Health Maintenance  Topic Date Due  . Hepatitis C Screening  Never done  . MAMMOGRAM  12/13/2009  . DEXA SCAN  Never done  . INFLUENZA VACCINE  05/19/2020  . COLONOSCOPY  12/25/2021  . TETANUS/TDAP  03/02/2028  . COVID-19 Vaccine  Completed  . PNA vac Low Risk Adult  Completed   Hepatitis C Screening- active future placed  Mammogram- plans to schedule  Dexa Scan- plans to schedule  Dental Screening: Recommended annual dental exams  for proper oral hygiene  Community Resource Referral / Chronic Care Management: CRR required this visit?  No   CCM required this visit?  No      Plan:   Keep all routine maintenance appointments.    Follow up 06/05/20 @ 11:00  I have personally reviewed and noted the following in the patient's chart:   . Medical and social history . Use of alcohol, tobacco or illicit drugs  . Current medications and supplements . Functional ability and status . Nutritional status . Physical activity . Advanced directives . List of other physicians . Hospitalizations, surgeries, and ER visits in previous 12 months . Vitals . Screenings to include cognitive, depression, and falls . Referrals and appointments  In addition, I have reviewed and discussed with patient certain preventive protocols, quality metrics, and best practice recommendations. A written personalized care plan for preventive services as well as general preventive health recommendations were provided to patient via mychart.     Varney Biles, LPN   3/54/5625    Agree with plan. Mable Paris, NP

## 2020-06-05 ENCOUNTER — Telehealth (INDEPENDENT_AMBULATORY_CARE_PROVIDER_SITE_OTHER): Payer: Medicare Other | Admitting: Family

## 2020-06-05 ENCOUNTER — Other Ambulatory Visit: Payer: Self-pay

## 2020-06-05 ENCOUNTER — Encounter: Payer: Self-pay | Admitting: Family

## 2020-06-05 VITALS — Ht 60.98 in | Wt 135.0 lb

## 2020-06-05 DIAGNOSIS — M2550 Pain in unspecified joint: Secondary | ICD-10-CM | POA: Insufficient documentation

## 2020-06-05 DIAGNOSIS — I10 Essential (primary) hypertension: Secondary | ICD-10-CM | POA: Diagnosis not present

## 2020-06-05 MED ORDER — LOSARTAN POTASSIUM 25 MG PO TABS: 12.5000 mg | ORAL_TABLET | Freq: Two times a day (BID) | ORAL | 1 refills | Status: DC | PRN

## 2020-06-05 NOTE — Progress Notes (Signed)
Verbal consent for services obtained from patient prior to services given to TELEPHONE visit:   Location of call:  provider at work patient at home  Names of all persons present for services: Mable Paris, NP and patient Chief complaint:  Would like to discuss HTN  Losartan made her very tired, dizzy.  She was falling asleep while at rest and driving. She has been on losartan qhs for one month. Felt tired on amlodipine as well.  Cannot take HCTZ 25mg  due to severe hypokalemia.  At home,121/71, 124/82, 121/77, 120/74. HR 85 Wanders if need blood pressure medication as blood pressure has been so normal.  Denies syncope, exertional chest pain or pressure, numbness or tingling radiating to left arm or jaw, palpitations, frequent headaches, changes in vision, or shortness of breath.   Complains fingers 'triggering' on right hand x for past month , comes and goes.   Bilateral hip and knee pain has become more noticeable during this time as well.  No joint swelling, fever, weight loss, tick bites, HA.  Describes stiffness in the morning and after sitting for long periods.  Using voltaren gel with some relief.  Strong family RA.    BP Readings from Last 3 Encounters:  03/06/20 (!) 130/94  01/23/20 118/76  08/03/19 120/70    A/P/next steps: Problem List Items Addressed This Visit      Cardiovascular and Mediastinum   Hypertension - Primary    Overall controlled however suspect adverse effects ( fatigue) from losartan. Discussed starting with PRN approach for BP > 130/80 and using 12.5mg  losartan BID. She will let me know how she is doing      Relevant Medications   losartan (COZAAR) 25 MG tablet   Other Relevant Orders   Comprehensive metabolic panel   ANA   C-reactive protein   CYCLIC CITRUL PEPTIDE ANTIBODY, IGG/IGA   Rheumatoid factor   Sedimentation rate   CBC with Differential/Platelet   Hemoglobin A1c   Hepatitis C antibody   Lipid panel   TSH   VITAMIN D 25  Hydroxy (Vit-D Deficiency, Fractures)     Other   Arthralgia    New ( to me). Difficulty assessing over telephone. Morning stiffness. Weight bearing joints. Discussed more likely OA presentation however strong family h/o RA raises clinical suspicion of RA. Pending labs and will follow up in 3 months. referral to rheumatology if labs abnormal certainly and perhaps either case due to family history; will discuss at follow up.      Relevant Orders   ANA   C-reactive protein   CYCLIC CITRUL PEPTIDE ANTIBODY, IGG/IGA   Rheumatoid factor   Sedimentation rate       I spent 23 min  discussing plan of care over the phone.

## 2020-06-05 NOTE — Patient Instructions (Signed)
As needed approach ( for now) with losartan with 12.5mg  twice daily  It is imperative that you are seen AT least twice per year for labs and monitoring. Monitor blood pressure at home and me 5-6 reading on separate days. Goal is less than 120/80, based on newest guidelines, however we certainly want to be less than 130/80;  if persistently higher, please make sooner follow up appointment so we can recheck you blood pressure and manage/ adjust medications.  Keep monitoring joint pain.

## 2020-06-05 NOTE — Assessment & Plan Note (Addendum)
Overall controlled however suspect adverse effects ( fatigue) from losartan. Discussed starting with PRN approach for BP > 130/80 and using 12.5mg  losartan BID. She will let me know how she is doing

## 2020-06-05 NOTE — Assessment & Plan Note (Signed)
New ( to me). Difficulty assessing over telephone. Morning stiffness. Weight bearing joints. Discussed more likely OA presentation however strong family h/o RA raises clinical suspicion of RA. Pending labs and will follow up in 3 months. referral to rheumatology if labs abnormal certainly and perhaps either case due to family history; will discuss at follow up.

## 2020-07-20 ENCOUNTER — Telehealth: Payer: Self-pay | Admitting: Family

## 2020-07-20 DIAGNOSIS — F32A Depression, unspecified: Secondary | ICD-10-CM

## 2020-07-25 NOTE — Telephone Encounter (Signed)
Pt needs refill on amitriptyline (ELAVIL) 50 MG tablet -she states that Dr Mable Paris was going to cut her back to 25mg  but she sent in 62. Pt is taking last pill tonight. Please advise ASAP

## 2020-07-25 NOTE — Telephone Encounter (Signed)
Hi Brock, okay to refill amitriptyline 50 mg PO qhs.

## 2020-08-06 ENCOUNTER — Other Ambulatory Visit: Payer: Self-pay | Admitting: Family

## 2020-08-06 DIAGNOSIS — I10 Essential (primary) hypertension: Secondary | ICD-10-CM

## 2020-11-01 ENCOUNTER — Telehealth: Payer: Self-pay | Admitting: Family

## 2020-11-01 DIAGNOSIS — I10 Essential (primary) hypertension: Secondary | ICD-10-CM

## 2020-11-01 MED ORDER — HYDROCHLOROTHIAZIDE 12.5 MG PO CAPS: ORAL_CAPSULE | ORAL | 1 refills | Status: DC

## 2020-11-01 NOTE — Telephone Encounter (Signed)
Pt needs a refill on hydrochlorothiazide (MICROZIDE) 12.5 MG capsule sent to [facility]

## 2020-12-06 ENCOUNTER — Ambulatory Visit (INDEPENDENT_AMBULATORY_CARE_PROVIDER_SITE_OTHER): Payer: Medicare Other

## 2020-12-06 ENCOUNTER — Other Ambulatory Visit: Payer: Self-pay

## 2020-12-06 DIAGNOSIS — Z23 Encounter for immunization: Secondary | ICD-10-CM | POA: Diagnosis not present

## 2020-12-11 ENCOUNTER — Ambulatory Visit: Payer: Medicare Other

## 2021-01-19 ENCOUNTER — Other Ambulatory Visit: Payer: Self-pay | Admitting: Family

## 2021-01-19 DIAGNOSIS — F32A Depression, unspecified: Secondary | ICD-10-CM

## 2021-01-24 ENCOUNTER — Ambulatory Visit: Payer: Medicare Other | Admitting: Family

## 2021-01-29 ENCOUNTER — Other Ambulatory Visit: Payer: Self-pay

## 2021-01-29 ENCOUNTER — Encounter: Payer: Self-pay | Admitting: Family

## 2021-01-29 ENCOUNTER — Ambulatory Visit (INDEPENDENT_AMBULATORY_CARE_PROVIDER_SITE_OTHER): Payer: Medicare Other | Admitting: Family

## 2021-01-29 VITALS — BP 116/74 | HR 91 | Temp 97.7°F | Ht 60.98 in | Wt 139.0 lb

## 2021-01-29 DIAGNOSIS — I10 Essential (primary) hypertension: Secondary | ICD-10-CM

## 2021-01-29 DIAGNOSIS — F32A Depression, unspecified: Secondary | ICD-10-CM

## 2021-01-29 DIAGNOSIS — R197 Diarrhea, unspecified: Secondary | ICD-10-CM

## 2021-01-29 LAB — COMPREHENSIVE METABOLIC PANEL
ALT: 12 U/L (ref 0–35)
AST: 14 U/L (ref 0–37)
Albumin: 4.2 g/dL (ref 3.5–5.2)
Alkaline Phosphatase: 90 U/L (ref 39–117)
BUN: 10 mg/dL (ref 6–23)
CO2: 33 mEq/L — ABNORMAL HIGH (ref 19–32)
Calcium: 9.5 mg/dL (ref 8.4–10.5)
Chloride: 102 mEq/L (ref 96–112)
Creatinine, Ser: 0.75 mg/dL (ref 0.40–1.20)
GFR: 82.08 mL/min (ref >60.00)
Glucose, Bld: 93 mg/dL (ref 70–99)
Potassium: 3.5 mEq/L (ref 3.5–5.1)
Sodium: 142 mEq/L (ref 135–145)
Total Bilirubin: 0.5 mg/dL (ref 0.2–1.2)
Total Protein: 6.8 g/dL (ref 6.0–8.3)

## 2021-01-29 MED ORDER — FLUOXETINE HCL 20 MG PO CAPS: 20.0000 mg | ORAL_CAPSULE | Freq: Every morning | ORAL | 3 refills | Status: DC

## 2021-01-29 MED ORDER — AMLODIPINE BESYLATE 2.5 MG PO TABS: 2.5000 mg | ORAL_TABLET | Freq: Every day | ORAL | 3 refills | Status: DC

## 2021-01-29 NOTE — Assessment & Plan Note (Signed)
Uncontrolled and most certainly exacerbated by chronic diarrhea. We opted to wean off Elavil and trial Prozac. Close follow up.

## 2021-01-29 NOTE — Assessment & Plan Note (Signed)
Controlled. Fatigue with losartan however this is to be multifactorial . Stop losartan and start amlodipine

## 2021-01-29 NOTE — Progress Notes (Signed)
Subjective:    Patient ID: Kelly Norris, female    DOB: [birth year], 70 y.o.   MRN: [id number]  CC: Kelly Norris is a 70 y.o. female who presents today for follow up.   HPI: Complains of liquid brown diarrhea every day for the past year. Occasional has small pieces of formed stool. No blood in stool, stool is not coffee ground, or melana in color. No vomiting, nausea. Endorses abdominal bloating, excessive gas, lower abdominal pain. Abdominal pain which comes and goes, occurring more days than not. Pain is present prior and after diarrhea. Pain is not exquisite. In the past she didn't have abdominal pain.  Diarrhea was first thing in the morning prior to eating however now progressed to all day. No relationship to food. No identifiable food triggers. This doesn't feel 'colitis' to her which she has had for many years.  This is effecting quality of life. She has no thoughts of hurting herself or suicide plan. She expected grandchild and is very excited ; she is worried with diarrhea however how she will help her daughter and wants to be able to fly to see her daughter, new grandchild but diarrhea limits this.  No hi.   She has gained weight. She is not as active as exercise is a trigger for diarrhea.   She started elavil 50mg  years ago for 'IBS' ; it is not helping IBS nor depression.   She has seen Dr Kelly Norris, Dr Kelly Norris, and Kelly Norris in the past.   HTN- compliant with losartan 25mg  qhs. She feels tired on losartan.   Work is good and she is able to work from home.    Consult with Dr Kelly Norris 2020. Discussed labs and from labs whether to focus on IBS, IBD. Unable to see labs from this visit including Colonoscopy March 2018 HISTORY:  Past Medical History:  Diagnosis Date  . Adenomatous colon polyp   . Allergy   . Colitis 2018   at periappendiceal orifice  . Depression   . Diverticulosis   . Endometriosis 1994  . H/O breast biopsy    x 3  . H/O hydronephrosis     left   . Hypertension    2004  . Kidney trouble    previous stent in the left kidney  . Osteoarthritis    cervical   . Paresthesias    thumb  . Seasonal allergies    Past Surgical History:  Procedure Laterality Date  . ABDOMINAL HYSTERECTOMY  1994   Partial (does not have cervix); endometriosis  . COLONOSCOPY WITH PROPOFOL N/A 12/25/2016   Procedure: COLONOSCOPY WITH PROPOFOL;  Surgeon: Kelly Sails, MD;  Location: [facility] ENDOSCOPY;  Service: Endoscopy;  Laterality: N/A;  . DILATION AND CURETTAGE OF UTERUS  92-94   2 times between 92-94  . URETERAL STENT PLACEMENT     Family History  Problem Relation Age of Onset  . Hypertension Mother   . Breast cancer Mother        age 69  . Colon polyps Mother   . Depression Mother   . Colitis Mother   . Hypertension Father   . Prostate cancer Father   . Heart disease Father        bypass surgery in his late 79 's  . Colon polyps Father   . Colitis Father   . Colitis Sister   . Other Sister        stomach polyps  . Rheum arthritis Sister   . Colon polyps  Brother   . Colitis Brother   . Rheum arthritis Brother   . Colitis Brother   . Rheum arthritis Brother   . Valvular heart disease Daughter        valvular heart disease bacterial endocarditis but no premature history  of cardiac  disease  . Valvular heart disease Daughter     Allergies: Epinephrine, Formaldehyde, Metrogel [metronidazole], and Other Current Outpatient Medications on File Prior to Visit  Medication Sig Dispense Refill  . cyanocobalamin 1000 MCG tablet Take 1,000 mcg by mouth every other day.    . fish oil-omega-3 fatty acids 1000 MG capsule Take 1 capsule by mouth daily. Take 1200 mg/day    . Ginkgo Biloba 40 MG TABS Take by mouth.    . Multiple Vitamins-Minerals (WOMENS 50+ ADVANCED PO) Take 1 tablet by mouth daily.     . Probiotic Product (SOLUBLE FIBER/PROBIOTICS PO) Take by mouth.     No current facility-administered medications on file prior to visit.     Social History   Tobacco Use  . Smoking status: Former Smoker    Years: 20.00    Types: Cigarettes    Quit date: 10/18/2013    Years since quitting: 7.2  . Smokeless tobacco: Never Used  . Tobacco comment: was smoking about 5 cigarettes daily  Vaping Use  . Vaping Use: Never used  Substance Use Topics  . Alcohol use: No  . Drug use: No    Review of Systems  Constitutional: Negative for chills and fever.  Respiratory: Negative for cough.   Cardiovascular: Negative for chest pain and palpitations.  Gastrointestinal: Positive for abdominal distention, abdominal pain and diarrhea. Negative for constipation, nausea and vomiting.      Objective:    BP 116/74   Pulse 91   Temp 97.7 F (36.5 C)   Ht 5' 0.98" (1.549 m)   Wt 139 lb (63 kg)   SpO2 98%   BMI 26.28 kg/m  BP Readings from Last 3 Encounters:  01/29/21 116/74  03/06/20 (!) 130/94  01/23/20 118/76   Wt Readings from Last 3 Encounters:  01/29/21 139 lb (63 kg)  06/05/20 135 lb (61.2 kg)  05/15/20 137 lb (62.1 kg)    Physical Exam Vitals reviewed.  Constitutional:      Appearance: Normal appearance. She is well-developed.  Eyes:     Conjunctiva/sclera: Conjunctivae normal.  Cardiovascular:     Rate and Rhythm: Normal rate and regular rhythm.     Pulses: Normal pulses.     Heart sounds: Normal heart sounds.  Pulmonary:     Effort: Pulmonary effort is normal.     Breath sounds: Normal breath sounds. No wheezing, rhonchi or rales.  Abdominal:     General: Bowel sounds are normal. There is distension.     Palpations: Abdomen is soft. Abdomen is not rigid. There is no fluid wave or mass.     Tenderness: There is no abdominal tenderness. There is no guarding or rebound.  Skin:    General: Skin is warm and dry.  Neurological:     Mental Status: She is alert.  Psychiatric:        Speech: Speech normal.        Behavior: Behavior normal.        Thought Content: Thought content normal.         Assessment & Plan:   Problem List Items Addressed This Visit      Cardiovascular and Mediastinum   Hypertension - Primary  Controlled. Fatigue with losartan however this is to be multifactorial . Stop losartan and start amlodipine      Relevant Medications   amLODipine (NORVASC) 2.5 MG tablet   Other Relevant Orders   Comprehensive metabolic panel     Other   Depression    Uncontrolled and most certainly exacerbated by chronic diarrhea. We opted to wean off Elavil and trial Prozac. Close follow up.       Relevant Medications   FLUoxetine (PROZAC) 20 MG capsule   Diarrhea    Afebrile. Non toxic in appearance. Worsening diarrhea. Pending GI pathogen panel, CT ab and pelvis to rule out bacterial etiology and ovarian pathology including malignancy. Pending CA 125. Referral to [facility] gastroenterology.       Relevant Orders   CT ABDOMEN PELVIS W CONTRAST   Gastrointestinal Pathogen Panel PCR   CA 125   Comprehensive metabolic panel   Ambulatory referral to Gastroenterology   GI pathogen panel by PCR, stool       I have discontinued Kelly Norris's losartan, hydrochlorothiazide, and amitriptyline. I am also having her start on amLODipine and FLUoxetine. Additionally, I am having her maintain her fish oil-omega-3 fatty acids, Multiple Vitamins-Minerals (WOMENS 50+ ADVANCED PO), Ginkgo Biloba, Probiotic Product (SOLUBLE FIBER/PROBIOTICS PO), and cyanocobalamin.   Meds ordered this encounter  Medications  . amLODipine (NORVASC) 2.5 MG tablet    Sig: Take 1 tablet (2.5 mg total) by mouth daily.    Dispense:  90 tablet    Refill:  3    Order Specific Question:   Supervising Provider    Answer:   Deborra Medina L [[id number]]  . FLUoxetine (PROZAC) 20 MG capsule    Sig: Take 1 capsule (20 mg total) by mouth every morning.    Dispense:  90 capsule    Refill:  3    Order Specific Question:   Supervising Provider    Answer:   Crecencio Mc [[id number]]    Return precautions  given.   Risks, benefits, and alternatives of the medications and treatment plan prescribed today were discussed, and patient expressed understanding.   Education regarding symptom management and diagnosis given to patient on AVS.  Continue to follow with Burnard Hawthorne, FNP for routine health maintenance.   Kelly Norris and I agreed with plan.   Kelly Paris, FNP

## 2021-01-29 NOTE — Assessment & Plan Note (Signed)
Afebrile. Non toxic in appearance. Worsening diarrhea. Pending GI pathogen panel, CT ab and pelvis to rule out bacterial etiology and ovarian pathology including malignancy. Pending CA 125. Referral to [facility] gastroenterology.

## 2021-01-29 NOTE — Patient Instructions (Addendum)
Please return GI pathogen panel to [facility] for quickest results.    First  Stop losartan and start amlodipine Then decrease notriptyline to 25mg  nightly for 1 week and then 25mg  every other day for one week then stop Once off the notriptyline, then start prozac. Referral for CT abdomen and to [facility] GI Let us know if you dont hear back within a week in regards to an appointment being scheduled.   Let me know you are doing

## 2021-01-30 ENCOUNTER — Telehealth: Payer: Self-pay | Admitting: Family

## 2021-01-30 LAB — CA 125: CA 125: 6 U/mL (ref <35)

## 2021-01-30 NOTE — Telephone Encounter (Signed)
lft vm for pt to call ofc to sch CT abdomen pelvis. Thanks

## 2021-02-04 ENCOUNTER — Telehealth: Payer: Self-pay | Admitting: Family

## 2021-02-04 DIAGNOSIS — R197 Diarrhea, unspecified: Secondary | ICD-10-CM

## 2021-02-04 NOTE — Telephone Encounter (Signed)
Call pt [facility] had declined referral  I admire Dr Vicente Males who is in the practice with [facility] She has been there before but not met him.  Would she be willing to meet dr Vicente Males?

## 2021-02-05 ENCOUNTER — Other Ambulatory Visit
Admission: RE | Admit: 2021-02-05 | Discharge: 2021-02-05 | Disposition: A | Discharge status: Home / Self Care | Payer: Medicare Other | Source: Ambulatory Visit | Attending: Family | Admitting: Family

## 2021-02-05 DIAGNOSIS — R197 Diarrhea, unspecified: Secondary | ICD-10-CM | POA: Insufficient documentation

## 2021-02-05 LAB — GASTROINTESTINAL PANEL BY PCR, STOOL (REPLACES STOOL CULTURE)

## 2021-02-05 LAB — C DIFFICILE QUICK SCREEN W PCR REFLEX
C Diff antigen: NEGATIVE
C Diff interpretation: NOT DETECTED
C Diff toxin: NEGATIVE

## 2021-02-05 NOTE — Telephone Encounter (Signed)
Referral placed.

## 2021-02-05 NOTE — Telephone Encounter (Signed)
She is willing to see specifically see Dr. Vicente Males if able to get her in. Pt is very frustrated & discouraged somewhat with area at this point. Pt just wants answers.

## 2021-02-06 ENCOUNTER — Encounter: Payer: Self-pay | Admitting: *Deleted

## 2021-02-19 ENCOUNTER — Other Ambulatory Visit: Payer: Self-pay

## 2021-02-19 ENCOUNTER — Ambulatory Visit
Admission: RE | Admit: 2021-02-19 | Discharge: 2021-02-19 | Disposition: A | Discharge status: Home / Self Care | Payer: Medicare Other | Source: Ambulatory Visit | Attending: Family | Admitting: Family

## 2021-02-19 DIAGNOSIS — I700 Atherosclerosis of aorta: Secondary | ICD-10-CM | POA: Insufficient documentation

## 2021-02-19 DIAGNOSIS — R197 Diarrhea, unspecified: Secondary | ICD-10-CM | POA: Diagnosis not present

## 2021-02-19 DIAGNOSIS — R109 Unspecified abdominal pain: Secondary | ICD-10-CM | POA: Diagnosis not present

## 2021-02-19 DIAGNOSIS — N200 Calculus of kidney: Secondary | ICD-10-CM | POA: Insufficient documentation

## 2021-02-19 DIAGNOSIS — K862 Cyst of pancreas: Secondary | ICD-10-CM | POA: Insufficient documentation

## 2021-02-19 MED ORDER — IOHEXOL 300 MG/ML  SOLN
85.0000 mL | Freq: Once | INTRAMUSCULAR | Status: AC | PRN
  Administered 2021-02-19: 85 mL via INTRAVENOUS

## 2021-02-27 ENCOUNTER — Telehealth: Payer: Self-pay | Admitting: Family

## 2021-02-27 DIAGNOSIS — K862 Cyst of pancreas: Secondary | ICD-10-CM

## 2021-02-27 NOTE — Telephone Encounter (Signed)
Call pt How is she doing ?   How is diarrhea?  She comes in at the end of this month and I would like to review her CT abdomen and pelvis in detail with her  No acute findings however incidental findings of cyst in liver and cystic lesion in pancreas. I would recommend to go ahead and arrange MRI abdomen . It is also shows atherosclerosis. I recommend starting cholesterol medication such as crestor 5mg    I would advise ahead of appointment that we arrange MRI abdomen which I have ordered if okay.  Please confirm that she NO metal in her body  Per referral notes, patient was going to call [facility] and schedule an appt however I do not see scheduled. Please ask her if we arrange for her with Dr Vicente Males. I strongly urge her to do this.

## 2021-02-27 NOTE — Telephone Encounter (Signed)
No sorry she wants to wait until after MRI. She said after CT she is worried if she even needs to see GI since nothing acute. She said that after MRI she will go from there.

## 2021-02-27 NOTE — Telephone Encounter (Signed)
Call pt  Did you ask her about seeing GI , Dr Vicente Males ?  Per referral notes , it looked like she wanted to schedule. But I don't see that she called , scheduled .  I would like you to call and make soonest available with [facility] if she is okay with that.   This may mean seeing another GI colleague of Dr Vicente Males , but I'm concerned and want her to be seen asap.  If you need new referral or me to [facility], l et me know but if you can schedule Kelly Norris , that would be great  Please route note back to me

## 2021-02-27 NOTE — Telephone Encounter (Signed)
I spoke with patient & she said that she would like to proceed with MRI. She said that she has had worsening diarrhea & sometimes now into the night. She is having nausea and pain. Pain is usually after eating. Patient does not have any metal in body. She wants to wait to discuss cholesterol med.

## 2021-02-28 NOTE — Telephone Encounter (Signed)
noted 

## 2021-03-12 ENCOUNTER — Other Ambulatory Visit: Payer: Self-pay

## 2021-03-12 ENCOUNTER — Encounter: Payer: Self-pay | Admitting: Family

## 2021-03-12 ENCOUNTER — Ambulatory Visit (INDEPENDENT_AMBULATORY_CARE_PROVIDER_SITE_OTHER): Payer: Medicare Other | Admitting: Family

## 2021-03-12 ENCOUNTER — Telehealth: Payer: Self-pay | Admitting: Family

## 2021-03-12 VITALS — BP 120/76 | HR 73 | Temp 98.1°F | Ht 60.98 in | Wt 134.5 lb

## 2021-03-12 DIAGNOSIS — R197 Diarrhea, unspecified: Secondary | ICD-10-CM | POA: Diagnosis not present

## 2021-03-12 DIAGNOSIS — I10 Essential (primary) hypertension: Secondary | ICD-10-CM

## 2021-03-12 DIAGNOSIS — I700 Atherosclerosis of aorta: Secondary | ICD-10-CM | POA: Diagnosis not present

## 2021-03-12 DIAGNOSIS — F32A Depression, unspecified: Secondary | ICD-10-CM

## 2021-03-12 MED ORDER — ALPRAZOLAM 0.5 MG PO TABS: 0.5000 mg | ORAL_TABLET | Freq: Once | ORAL | 1 refills | Status: AC

## 2021-03-12 MED ORDER — POTASSIUM CHLORIDE CRYS ER 10 MEQ PO TBCR: 10.0000 meq | EXTENDED_RELEASE_TABLET | Freq: Every day | ORAL | 1 refills | Status: DC

## 2021-03-12 NOTE — Telephone Encounter (Signed)
Call pt I meant to advise her that her CT abdomen did show atherosclerosis which is essentially cholesterol build up  I know she doesn't want to take more medications however I do recommend low dose cholesterol medication when we see this such as crestor 5mg . I would also advise that in the near future we look at her lipid panel. Sorry I didn't add that today  If she would like lipid panel, please order and sch as well as let me  Know if she would like to start crestor

## 2021-03-12 NOTE — Patient Instructions (Signed)
Return stool specimen  Start potassium chloride since on hctz  Please call and make an appointment with Dr Vicente Males after your MRI abdomen

## 2021-03-12 NOTE — Progress Notes (Signed)
Subjective:    Patient ID: Kelly Norris, female    DOB: 08-02-1951, 70 y.o.   MRN: [id number]  CC: Kelly Norris is a 70 y.o. female who presents today for follow up.   HPI: She is feeling better and depression has improved with prozac 20mg .   Concentration and energy has improved.    Diarrhea improved and not 'all day long.' Nonbloody diarrhea. Diarrhea occurs only after breakfast. She has intermittent stools that are white in color. She describes as looking like 'fat' . For years she has questioned pancreatic insufficiency.   Reports feeling very tired on amlodipine and she has stopped and resumed prior hctz 25mg  qd for 2 weeks.  She is not on potassium.  No cp, sob.     MR abdomen for pancreatitic lesion scheduled. She is claustrophobic and would like medication to take prior.   HISTORY:  Past Medical History:  Diagnosis Date  . Adenomatous colon polyp   . Allergy   . Colitis 2018   at periappendiceal orifice  . Depression   . Diverticulosis   . Endometriosis 1994  . H/O breast biopsy    x 3  . H/O hydronephrosis    left   . Hypertension    2004  . Kidney trouble    previous stent in the left kidney  . Osteoarthritis    cervical   . Paresthesias    thumb  . Seasonal allergies    Past Surgical History:  Procedure Laterality Date  . ABDOMINAL HYSTERECTOMY  1994   Partial (does not have cervix); endometriosis  . COLONOSCOPY WITH PROPOFOL N/A 12/25/2016   Procedure: COLONOSCOPY WITH PROPOFOL;  Surgeon: Lollie Sails, MD;  Location: [facility] ENDOSCOPY;  Service: Endoscopy;  Laterality: N/A;  . DILATION AND CURETTAGE OF UTERUS  92-94   2 times between 92-94  . URETERAL STENT PLACEMENT     Family History  Problem Relation Age of Onset  . Hypertension Mother   . Breast cancer Mother        age 26  . Colon polyps Mother   . Depression Mother   . Colitis Mother   . Hypertension Father   . Prostate cancer Father   . Heart disease Father         bypass surgery in his late 19 's  . Colon polyps Father   . Colitis Father   . Colitis Sister   . Other Sister        stomach polyps  . Rheum arthritis Sister   . Colon polyps Brother   . Colitis Brother   . Rheum arthritis Brother   . Colitis Brother   . Rheum arthritis Brother   . Valvular heart disease Daughter        valvular heart disease bacterial endocarditis but no premature history  of cardiac  disease  . Valvular heart disease Daughter     Allergies: Epinephrine, Formaldehyde, Metrogel [metronidazole], Norvasc [amlodipine], and Other Current Outpatient Medications on File Prior to Visit  Medication Sig Dispense Refill  . hydrochlorothiazide (MICROZIDE) 12.5 MG capsule Take 25 mg by mouth daily.    . cyanocobalamin 1000 MCG tablet Take 1,000 mcg by mouth every other day.    . fish oil-omega-3 fatty acids 1000 MG capsule Take 1 capsule by mouth daily. Take 1200 mg/day    . FLUoxetine (PROZAC) 20 MG capsule Take 1 capsule (20 mg total) by mouth every morning. 90 capsule 3  . Ginkgo Biloba 40 MG TABS Take by  mouth.    . Multiple Vitamins-Minerals (WOMENS 50+ ADVANCED PO) Take 1 tablet by mouth daily.     . Probiotic Product (SOLUBLE FIBER/PROBIOTICS PO) Take by mouth.     No current facility-administered medications on file prior to visit.    Social History   Tobacco Use  . Smoking status: Former Smoker    Years: 20.00    Types: Cigarettes    Quit date: 10/18/2013    Years since quitting: 7.4  . Smokeless tobacco: Never Used  . Tobacco comment: was smoking about 5 cigarettes daily  Vaping Use  . Vaping Use: Never used  Substance Use Topics  . Alcohol use: No  . Drug use: No    Review of Systems  Constitutional: Negative for chills and fever.  Respiratory: Negative for cough.   Cardiovascular: Negative for chest pain and palpitations.  Gastrointestinal: Positive for diarrhea. Negative for abdominal pain, blood in stool, nausea and vomiting.      Objective:     BP 120/76 (BP Location: Left Arm, Patient Position: Sitting, Cuff Size: Large)   Pulse 73   Temp 98.1 F (36.7 C) (Oral)   Ht 5' 0.98" (1.549 m)   Wt 134 lb 8 oz (61 kg)   SpO2 98%   BMI 25.43 kg/m  BP Readings from Last 3 Encounters:  03/12/21 120/76  01/29/21 116/74  03/06/20 (!) 130/94   Wt Readings from Last 3 Encounters:  03/12/21 134 lb 8 oz (61 kg)  01/29/21 139 lb (63 kg)  06/05/20 135 lb (61.2 kg)    Physical Exam Vitals reviewed.  Constitutional:      Appearance: Normal appearance. She is well-developed.  Eyes:     Conjunctiva/sclera: Conjunctivae normal.  Cardiovascular:     Rate and Rhythm: Normal rate and regular rhythm.     Pulses: Normal pulses.     Heart sounds: Normal heart sounds.  Pulmonary:     Effort: Pulmonary effort is normal.     Breath sounds: Normal breath sounds. No wheezing, rhonchi or rales.  Abdominal:     General: Bowel sounds are normal. There is no distension.     Palpations: Abdomen is soft. Abdomen is not rigid. There is no fluid wave or mass.     Tenderness: There is no abdominal tenderness. There is no guarding or rebound.  Skin:    General: Skin is warm and dry.  Neurological:     Mental Status: She is alert.  Psychiatric:        Speech: Speech normal.        Behavior: Behavior normal.        Thought Content: Thought content normal.        Assessment & Plan:   Problem List Items Addressed This Visit      Cardiovascular and Mediastinum   Atherosclerosis of aorta (HCC)   Relevant Medications   hydrochlorothiazide (MICROZIDE) 12.5 MG capsule   Hypertension - Primary    Controlled. Continue hctz 25mg  and I have counseled patient regarding importance of repeat labs ongoing due to h/o hypokalemia. Start potassium chloride 13meq qd.       Relevant Medications   hydrochlorothiazide (MICROZIDE) 12.5 MG capsule   potassium chloride (KLOR-CON) 10 MEQ tablet   Other Relevant Orders   Basic metabolic panel (Completed)      Other   Depression    Chronic, improved . Continue prozac 20mg       Diarrhea    Improved. Unsure role of prozac, hctz. Advised again  to call and schedule with [facility] which she plans to do once she has MRI abdomen. Pending fecal fat test and advised that I would order however for interpretation would rely on GI consult.       Relevant Orders   Fecal fat, qualitative   Basic metabolic panel (Completed)       I have discontinued Wells Taylor-Hicks's amLODipine. I am also having her start on potassium chloride and ALPRAZolam. Additionally, I am having her maintain her fish oil-omega-3 fatty acids, Multiple Vitamins-Minerals (WOMENS 50+ ADVANCED PO), Ginkgo Biloba, Probiotic Product (SOLUBLE FIBER/PROBIOTICS PO), cyanocobalamin, FLUoxetine, and hydrochlorothiazide.   Meds ordered this encounter  Medications  . potassium chloride (KLOR-CON) 10 MEQ tablet    Sig: Take 1 tablet (10 mEq total) by mouth daily.    Dispense:  90 tablet    Refill:  1    Order Specific Question:   Supervising Provider    Answer:   Deborra Medina L [[id number]]  . ALPRAZolam (XANAX) 0.5 MG tablet    Sig: Take 1 tablet (0.5 mg total) by mouth once for 1 dose. Take 15 - 30 minutes prior to appointment time.    Dispense:  2 tablet    Refill:  1    Order Specific Question:   Supervising Provider    Answer:   Crecencio Mc [[id number]]    Return precautions given.   Risks, benefits, and alternatives of the medications and treatment plan prescribed today were discussed, and patient expressed understanding.   Education regarding symptom management and diagnosis given to patient on AVS.  Continue to follow with Burnard Hawthorne, FNP for routine health maintenance.   Luna Fuse and I agreed with plan.   Mable Paris, FNP

## 2021-03-13 ENCOUNTER — Other Ambulatory Visit: Payer: Self-pay

## 2021-03-13 DIAGNOSIS — I700 Atherosclerosis of aorta: Secondary | ICD-10-CM | POA: Insufficient documentation

## 2021-03-13 LAB — BASIC METABOLIC PANEL
BUN: 10 mg/dL (ref 6–23)
CO2: 33 mEq/L — ABNORMAL HIGH (ref 19–32)
Calcium: 10 mg/dL (ref 8.4–10.5)
Chloride: 97 mEq/L (ref 96–112)
Creatinine, Ser: 0.74 mg/dL (ref 0.40–1.20)
GFR: 83.34 mL/min (ref >60.00)
Glucose, Bld: 96 mg/dL (ref 70–99)
Potassium: 2.9 mEq/L — ABNORMAL LOW (ref 3.5–5.1)
Sodium: 140 mEq/L (ref 135–145)

## 2021-03-13 MED ORDER — HYDROCHLOROTHIAZIDE 25 MG PO TABS: 25.0000 mg | ORAL_TABLET | Freq: Every day | ORAL | 3 refills | Status: DC

## 2021-03-13 NOTE — Assessment & Plan Note (Signed)
Improved. Unsure role of prozac, hctz. Advised again to call and schedule with [facility] which she plans to do once she has MRI abdomen. Pending fecal fat test and advised that I would order however for interpretation would rely on GI consult.

## 2021-03-13 NOTE — Telephone Encounter (Signed)
Patient would prefer to wait since just changing meds back & getting adjusted all over again. She is scheduled July 8th & will address then.

## 2021-03-13 NOTE — Assessment & Plan Note (Signed)
Controlled. Continue hctz 25mg  and I have counseled patient regarding importance of repeat labs ongoing due to h/o hypokalemia. Start potassium chloride 50meq qd.

## 2021-03-13 NOTE — Assessment & Plan Note (Signed)
Chronic, improved . Continue prozac 20mg 

## 2021-03-18 ENCOUNTER — Other Ambulatory Visit: Payer: Self-pay | Admitting: Family

## 2021-03-18 ENCOUNTER — Ambulatory Visit
Admission: RE | Admit: 2021-03-18 | Discharge: 2021-03-18 | Disposition: A | Discharge status: Home / Self Care | Payer: Medicare Other | Source: Ambulatory Visit | Attending: Family | Admitting: Family

## 2021-03-18 ENCOUNTER — Other Ambulatory Visit: Payer: Self-pay

## 2021-03-18 DIAGNOSIS — K862 Cyst of pancreas: Secondary | ICD-10-CM | POA: Diagnosis not present

## 2021-03-18 DIAGNOSIS — R197 Diarrhea, unspecified: Secondary | ICD-10-CM | POA: Diagnosis not present

## 2021-03-18 MED ORDER — GADOBUTROL 1 MMOL/ML IV SOLN
6.0000 mL | Freq: Once | INTRAVENOUS | Status: AC | PRN
  Administered 2021-03-18: 6 mL via INTRAVENOUS

## 2021-03-18 NOTE — Telephone Encounter (Signed)
Noted  

## 2021-03-19 ENCOUNTER — Encounter: Payer: Self-pay | Admitting: Family

## 2021-03-19 ENCOUNTER — Telehealth: Payer: Self-pay | Admitting: *Deleted

## 2021-03-19 DIAGNOSIS — K8689 Other specified diseases of pancreas: Secondary | ICD-10-CM | POA: Insufficient documentation

## 2021-03-19 NOTE — Telephone Encounter (Signed)
Patient was returning call about results

## 2021-03-19 NOTE — Telephone Encounter (Signed)
-----   Message from Burnard Hawthorne, [place] sent at 03/19/2021  1:35 PM EDT ----- CALL-  MRI shows  cystic lesion in the pancreatic tail which requires surveillance. I would advise dedicated appointment with Dr Vicente Males in regards to this and discussion as to whether further evaluation needs to be done prior to 2 years.   You had said you wanted to make an appointment- please let me know date once scheduled.   Let me know if cannot reach patient.

## 2021-03-19 NOTE — Telephone Encounter (Signed)
Left message to call office

## 2021-03-20 NOTE — Telephone Encounter (Signed)
See result note.  

## 2021-03-21 LAB — FECAL FAT, QUALITATIVE
Fat Qual Neutral, Stl: NORMAL
Fat Qual Total, Stl: NORMAL

## 2021-03-25 ENCOUNTER — Other Ambulatory Visit: Payer: Self-pay

## 2021-03-25 DIAGNOSIS — I10 Essential (primary) hypertension: Secondary | ICD-10-CM

## 2021-03-25 DIAGNOSIS — E876 Hypokalemia: Secondary | ICD-10-CM

## 2021-03-26 ENCOUNTER — Other Ambulatory Visit: Payer: Medicare Other

## 2021-03-26 ENCOUNTER — Other Ambulatory Visit: Payer: Self-pay

## 2021-03-28 ENCOUNTER — Other Ambulatory Visit: Payer: Self-pay

## 2021-03-28 ENCOUNTER — Other Ambulatory Visit (INDEPENDENT_AMBULATORY_CARE_PROVIDER_SITE_OTHER): Payer: Medicare Other

## 2021-03-28 DIAGNOSIS — E876 Hypokalemia: Secondary | ICD-10-CM | POA: Diagnosis not present

## 2021-03-28 LAB — BASIC METABOLIC PANEL
BUN: 18 mg/dL (ref 6–23)
CO2: 33 mEq/L — ABNORMAL HIGH (ref 19–32)
Calcium: 9.6 mg/dL (ref 8.4–10.5)
Chloride: 99 mEq/L (ref 96–112)
Creatinine, Ser: 0.72 mg/dL (ref 0.40–1.20)
GFR: 86.1 mL/min (ref >60.00)
Glucose, Bld: 89 mg/dL (ref 70–99)
Potassium: 3.2 mEq/L — ABNORMAL LOW (ref 3.5–5.1)
Sodium: 141 mEq/L (ref 135–145)

## 2021-03-30 ENCOUNTER — Other Ambulatory Visit: Payer: Self-pay | Admitting: Family

## 2021-03-30 DIAGNOSIS — I10 Essential (primary) hypertension: Secondary | ICD-10-CM

## 2021-03-30 MED ORDER — POTASSIUM CHLORIDE CRYS ER 10 MEQ PO TBCR: 20.0000 meq | EXTENDED_RELEASE_TABLET | Freq: Every day | ORAL | 2 refills | Status: DC

## 2021-03-31 NOTE — Addendum Note (Signed)
Addended by: Nanci Pina on: 03/31/2021 03:04 PM   Modules accepted: Orders

## 2021-04-01 ENCOUNTER — Other Ambulatory Visit: Payer: Self-pay

## 2021-04-01 DIAGNOSIS — E876 Hypokalemia: Secondary | ICD-10-CM

## 2021-04-06 ENCOUNTER — Other Ambulatory Visit: Payer: Self-pay | Admitting: Family

## 2021-04-06 DIAGNOSIS — I10 Essential (primary) hypertension: Secondary | ICD-10-CM

## 2021-04-09 ENCOUNTER — Other Ambulatory Visit: Payer: Self-pay

## 2021-04-09 ENCOUNTER — Other Ambulatory Visit (INDEPENDENT_AMBULATORY_CARE_PROVIDER_SITE_OTHER): Payer: Medicare Other

## 2021-04-09 DIAGNOSIS — E876 Hypokalemia: Secondary | ICD-10-CM | POA: Diagnosis not present

## 2021-04-09 LAB — BASIC METABOLIC PANEL
BUN: 18 mg/dL (ref 6–23)
CO2: 32 mEq/L (ref 19–32)
Calcium: 9.8 mg/dL (ref 8.4–10.5)
Chloride: 98 mEq/L (ref 96–112)
Creatinine, Ser: 0.78 mg/dL (ref 0.40–1.20)
GFR: 78.2 mL/min (ref >60.00)
Glucose, Bld: 96 mg/dL (ref 70–99)
Potassium: 3.2 mEq/L — ABNORMAL LOW (ref 3.5–5.1)
Sodium: 140 mEq/L (ref 135–145)

## 2021-04-11 ENCOUNTER — Other Ambulatory Visit: Payer: Self-pay

## 2021-04-11 ENCOUNTER — Other Ambulatory Visit: Payer: Self-pay | Admitting: Family

## 2021-04-11 DIAGNOSIS — E876 Hypokalemia: Secondary | ICD-10-CM

## 2021-04-11 DIAGNOSIS — I10 Essential (primary) hypertension: Secondary | ICD-10-CM

## 2021-04-11 MED ORDER — TRIAMTERENE-HCTZ 37.5-25 MG PO TABS: 1.0000 | ORAL_TABLET | Freq: Every day | ORAL | 3 refills | Status: AC

## 2021-04-14 ENCOUNTER — Telehealth: Payer: Self-pay

## 2021-04-14 NOTE — Telephone Encounter (Signed)
LMTCB need to speak with patient.

## 2021-04-22 ENCOUNTER — Telehealth: Payer: Self-pay | Admitting: *Deleted

## 2021-04-22 DIAGNOSIS — K529 Noninfective gastroenteritis and colitis, unspecified: Secondary | ICD-10-CM | POA: Diagnosis not present

## 2021-04-22 DIAGNOSIS — R1084 Generalized abdominal pain: Secondary | ICD-10-CM | POA: Diagnosis not present

## 2021-04-22 DIAGNOSIS — I10 Essential (primary) hypertension: Secondary | ICD-10-CM

## 2021-04-22 NOTE — Telephone Encounter (Signed)
Please place future orders for lab appt.  

## 2021-04-24 ENCOUNTER — Other Ambulatory Visit: Payer: Self-pay

## 2021-04-24 ENCOUNTER — Other Ambulatory Visit (INDEPENDENT_AMBULATORY_CARE_PROVIDER_SITE_OTHER): Payer: Medicare Other

## 2021-04-24 DIAGNOSIS — I10 Essential (primary) hypertension: Secondary | ICD-10-CM

## 2021-04-25 ENCOUNTER — Ambulatory Visit (INDEPENDENT_AMBULATORY_CARE_PROVIDER_SITE_OTHER): Payer: Medicare Other | Admitting: Family

## 2021-04-25 ENCOUNTER — Other Ambulatory Visit: Payer: Self-pay

## 2021-04-25 ENCOUNTER — Encounter: Payer: Self-pay | Admitting: Family

## 2021-04-25 VITALS — BP 130/80 | HR 75 | Ht 60.98 in | Wt 130.1 lb

## 2021-04-25 DIAGNOSIS — F32A Depression, unspecified: Secondary | ICD-10-CM

## 2021-04-25 DIAGNOSIS — I10 Essential (primary) hypertension: Secondary | ICD-10-CM

## 2021-04-25 DIAGNOSIS — K8689 Other specified diseases of pancreas: Secondary | ICD-10-CM | POA: Diagnosis not present

## 2021-04-25 LAB — BASIC METABOLIC PANEL
BUN: 15 mg/dL (ref 6–23)
CO2: 30 mEq/L (ref 19–32)
Calcium: 9.6 mg/dL (ref 8.4–10.5)
Chloride: 99 mEq/L (ref 96–112)
Creatinine, Ser: 0.84 mg/dL (ref 0.40–1.20)
GFR: 71.52 mL/min (ref >60.00)
Glucose, Bld: 100 mg/dL — ABNORMAL HIGH (ref 70–99)
Potassium: 3.3 mEq/L — ABNORMAL LOW (ref 3.5–5.1)
Sodium: 140 mEq/L (ref 135–145)

## 2021-04-25 NOTE — Assessment & Plan Note (Signed)
Improved. Continue prozac 20mg 

## 2021-04-25 NOTE — Patient Instructions (Addendum)
Stop hctz Stop potassium chloride  Start triamterene - hctz and lab in one week after starting .  Nice to see you!

## 2021-04-25 NOTE — Assessment & Plan Note (Addendum)
Recurring hypokalemia which most likely from diuretics and further complicated by chronic diarrhea. Advised ARB or CCB are safer choice in setting of diarrhea. She has intolerances to amlodipine and losartan in the past. Start triamterene -hctz. Stop hctz 25mg , potassium chloride 10 meq BID. If no resolution of hypokalemia, advised patient she will need further evaluation for primary aldosteronism.  Case reviewed with supervising, Dr Deborra Medina, and she and I jointly agreed on management plan.

## 2021-04-25 NOTE — Progress Notes (Signed)
Subjective:    Patient ID: Kelly Norris, female    DOB: 09-20-1951, 70 y.o.   MRN: [id number]  CC: Kelly Norris is a 70 y.o. female who presents today for follow up.   HPI: Feels well today No new complaints   She hasnt started triamterene -hctz as she has been traveling. She has been taking hctz 25mg  with potassium chloride 10 meq BID.  Diarrhea has been under better control.   Depression- compliant with prozac. Feeling more hopeful and less depression. She had a great trip visiting her daughters recently.   Pancreatic lesion, colitis- upcoming appointment with Dr Vicente Males.    HISTORY:  Past Medical History:  Diagnosis Date   Adenomatous colon polyp    Allergy    Colitis 2018   at periappendiceal orifice   Depression    Diverticulosis    Endometriosis 1994   H/O breast biopsy    x 3   H/O hydronephrosis    left    Hypertension    2004   Kidney trouble    previous stent in the left kidney   Osteoarthritis    cervical    Paresthesias    thumb   Seasonal allergies    Past Surgical History:  Procedure Laterality Date   ABDOMINAL HYSTERECTOMY  1994   Partial (does not have cervix); endometriosis   COLONOSCOPY WITH PROPOFOL N/A 12/25/2016   Procedure: COLONOSCOPY WITH PROPOFOL;  Surgeon: Lollie Sails, MD;  Location: [facility] ENDOSCOPY;  Service: Endoscopy;  Laterality: N/A;   DILATION AND CURETTAGE OF UTERUS  92-94   2 times between 92-94   URETERAL STENT PLACEMENT     Family History  Problem Relation Age of Onset   Hypertension Mother    Breast cancer Mother        age 48   Colon polyps Mother    Depression Mother    Colitis Mother    Hypertension Father    Prostate cancer Father    Heart disease Father        bypass surgery in his late 23 's   Colon polyps Father    Colitis Father    Colitis Sister    Other Sister        stomach polyps   Rheum arthritis Sister    Colon polyps Brother    Colitis Brother    Rheum arthritis Brother     Colitis Brother    Rheum arthritis Brother    Valvular heart disease Daughter        valvular heart disease bacterial endocarditis but no premature history  of cardiac  disease   Valvular heart disease Daughter     Allergies: Epinephrine, Formaldehyde, Metrogel [metronidazole], Norvasc [amlodipine], and Other Current Outpatient Medications on File Prior to Visit  Medication Sig Dispense Refill   cyanocobalamin 1000 MCG tablet Take 1,000 mcg by mouth every other day.     fish oil-omega-3 fatty acids 1000 MG capsule Take 1 capsule by mouth daily. Take 1200 mg/day     FLUoxetine (PROZAC) 20 MG capsule Take 1 capsule (20 mg total) by mouth every morning. 90 capsule 3   Ginkgo Biloba 40 MG TABS Take by mouth.     Multiple Vitamins-Minerals (WOMENS 50+ ADVANCED PO) Take 1 tablet by mouth daily.      Probiotic Product (SOLUBLE FIBER/PROBIOTICS PO) Take by mouth.     triamterene-hydrochlorothiazide (MAXZIDE-25) 37.5-25 MG tablet Take 1 tablet by mouth daily. (Patient not taking: Reported on 04/25/2021) 90 tablet 3  No current facility-administered medications on file prior to visit.    Social History   Tobacco Use   Smoking status: Former    Years: 20.00    Pack years: 0.00    Types: Cigarettes    Quit date: 10/18/2013    Years since quitting: 7.5   Smokeless tobacco: Never   Tobacco comments:    was smoking about 5 cigarettes daily  Vaping Use   Vaping Use: Never used  Substance Use Topics   Alcohol use: No   Drug use: No    Review of Systems  Constitutional:  Negative for chills and fever.  Respiratory:  Negative for cough.   Cardiovascular:  Negative for chest pain and palpitations.  Gastrointestinal:  Positive for diarrhea (chronic). Negative for nausea and vomiting.     Objective:    BP 130/80 (BP Location: Left Arm, Patient Position: Sitting, Cuff Size: Large)   Pulse 75   Ht 5' 0.98" (1.549 m)   Wt 130 lb 1.6 oz (59 kg)   SpO2 98%   BMI 24.60 kg/m  BP Readings  from Last 3 Encounters:  04/25/21 130/80  03/12/21 120/76  01/29/21 116/74   Wt Readings from Last 3 Encounters:  04/25/21 130 lb 1.6 oz (59 kg)  03/12/21 134 lb 8 oz (61 kg)  01/29/21 139 lb (63 kg)    Physical Exam Vitals reviewed.  Constitutional:      Appearance: She is well-developed.  Eyes:     Conjunctiva/sclera: Conjunctivae normal.  Cardiovascular:     Rate and Rhythm: Normal rate and regular rhythm.     Pulses: Normal pulses.     Heart sounds: Normal heart sounds.  Pulmonary:     Effort: Pulmonary effort is normal.     Breath sounds: Normal breath sounds. No wheezing, rhonchi or rales.  Skin:    General: Skin is warm and dry.  Neurological:     Mental Status: She is alert.  Psychiatric:        Speech: Speech normal.        Behavior: Behavior normal.        Thought Content: Thought content normal.       Assessment & Plan:   Problem List Items Addressed This Visit       Cardiovascular and Mediastinum   Hypertension - Primary    Recurring hypokalemia which most likely from diuretics and further complicated by chronic diarrhea. Advised ARB or CCB are safer choice in setting of diarrhea. She has intolerances to amlodipine and losartan in the past. Start triamterene -hctz. Stop hctz 25mg , potassium chloride 10 meq BID. If no resolution of hypokalemia, advised patient she will need further evaluation for primary aldosteronism.  Case reviewed with supervising, Dr Deborra Medina, and she and I jointly agreed on management plan.         Relevant Orders   Basic metabolic panel   Magnesium     Other   Depression    Improved. Continue prozac 20mg        Pancreatic mass    Upcoming appointment with Dr Vicente Males . Discussed at length the importance of discussion of appropriate surveillance and time frame thereof. She declines oncology consult for second opinion at this time. Will await Dr Georgeann Oppenheim advice.          I am having Luna Fuse maintain her fish  oil-omega-3 fatty acids, Multiple Vitamins-Minerals (WOMENS 50+ ADVANCED PO), Ginkgo Biloba, Probiotic Product (SOLUBLE FIBER/PROBIOTICS PO), cyanocobalamin, FLUoxetine, and triamterene-hydrochlorothiazide.   No  orders of the defined types were placed in this encounter.   Return precautions given.   Risks, benefits, and alternatives of the medications and treatment plan prescribed today were discussed, and patient expressed understanding.   Education regarding symptom management and diagnosis given to patient on AVS.  Continue to follow with Burnard Hawthorne, FNP for routine health maintenance.   Luna Fuse and I agreed with plan.   Mable Paris, FNP

## 2021-04-25 NOTE — Assessment & Plan Note (Signed)
Upcoming appointment with Dr Vicente Males . Discussed at length the importance of discussion of appropriate surveillance and time frame thereof. She declines oncology consult for second opinion at this time. Will await Dr Georgeann Oppenheim advice.

## 2021-05-16 ENCOUNTER — Ambulatory Visit: Payer: Medicare Other

## 2021-05-16 ENCOUNTER — Other Ambulatory Visit: Payer: Self-pay

## 2021-05-19 ENCOUNTER — Ambulatory Visit (INDEPENDENT_AMBULATORY_CARE_PROVIDER_SITE_OTHER): Payer: Medicare Other

## 2021-05-19 VITALS — Ht 60.98 in | Wt 130.0 lb

## 2021-05-19 DIAGNOSIS — Z0000 Encounter for general adult medical examination without abnormal findings: Secondary | ICD-10-CM | POA: Diagnosis not present

## 2021-05-19 NOTE — Patient Instructions (Addendum)
Ms. Kelly Norris , Thank you for taking time to come for your Medicare Wellness Visit. I appreciate your ongoing commitment to your health goals. Please review the following plan we discussed and let me know if I can assist you in the future.   These are the goals we discussed:  Goals       Patient Stated     Increase physical activity (pt-stated)      I would love to walk more for exercise as tolerated        This is a list of the screening recommended for you and due dates:  Health Maintenance  Topic Date Due   Flu Shot  07/05/2021*   Zoster (Shingles) Vaccine (1 of 2) 08/19/2021*   Mammogram  05/19/2022*   DEXA scan (bone density measurement)  05/19/2022*   Hepatitis C Screening: USPSTF Recommendation to screen - Ages 18-79 yo.  05/19/2022*   Colon Cancer Screening  12/25/2021   Tetanus Vaccine  03/02/2028   COVID-19 Vaccine  Completed   Pneumonia vaccines  Completed   HPV Vaccine  Aged Out  *Topic was postponed. The date shown is not the original due date.    Advanced directives: End of life planning; Advance aging; Advanced directives discussed.  Copy of current HCPOA/Living Will requested.    Conditions/risks identified: none new  Follow up in one year for your annual wellness visit    Preventive Care 65 Years and Older, Female Preventive care refers to lifestyle choices and visits with your health care provider that can promote health and wellness. What does preventive care include? A yearly physical exam. This is also called an annual well check. Dental exams once or twice a year. Routine eye exams. Ask your health care provider how often you should have your eyes checked. Personal lifestyle choices, including: Daily care of your teeth and gums. Regular physical activity. Eating a healthy diet. Avoiding tobacco and drug use. Limiting alcohol use. Practicing safe sex. Taking low-dose aspirin every day. Taking vitamin and mineral supplements as recommended by  your health care provider. What happens during an annual well check? The services and screenings done by your health care provider during your annual well check will depend on your age, overall health, lifestyle risk factors, and family history of disease. Counseling  Your health care provider may ask you questions about your: Alcohol use. Tobacco use. Drug use. Emotional well-being. Home and relationship well-being. Sexual activity. Eating habits. History of falls. Memory and ability to understand (cognition). Work and work Statistician. Reproductive health. Screening  You may have the following tests or measurements: Height, weight, and BMI. Blood pressure. Lipid and cholesterol levels. These may be checked every 5 years, or more frequently if you are over 53 years old. Skin check. Lung cancer screening. You may have this screening every year starting at age 75 if you have a 30-pack-year history of smoking and currently smoke or have quit within the past 15 years. Fecal occult blood test (FOBT) of the stool. You may have this test every year starting at age 71. Flexible sigmoidoscopy or colonoscopy. You may have a sigmoidoscopy every 5 years or a colonoscopy every 10 years starting at age 18. Hepatitis C blood test. Hepatitis B blood test. Sexually transmitted disease (STD) testing. Diabetes screening. This is done by checking your blood sugar (glucose) after you have not eaten for a while (fasting). You may have this done every 1-3 years. Bone density scan. This is done to screen for osteoporosis. You  may have this done starting at age 67. Mammogram. This may be done every 1-2 years. Talk to your health care provider about how often you should have regular mammograms. Talk with your health care provider about your test results, treatment options, and if necessary, the need for more tests. Vaccines  Your health care provider may recommend certain vaccines, such as: Influenza  vaccine. This is recommended every year. Tetanus, diphtheria, and acellular pertussis (Tdap, Td) vaccine. You may need a Td booster every 10 years. Zoster vaccine. You may need this after age 28. Pneumococcal 13-valent conjugate (PCV13) vaccine. One dose is recommended after age 86. Pneumococcal polysaccharide (PPSV23) vaccine. One dose is recommended after age 60. Talk to your health care provider about which screenings and vaccines you need and how often you need them. This information is not intended to replace advice given to you by your health care provider. Make sure you discuss any questions you have with your health care provider. Document Released: 11/01/2015 Document Revised: 06/24/2016 Document Reviewed: 08/06/2015 Elsevier Interactive Patient Education  2017 [place] Prevention in the Home Falls can cause injuries. They can happen to people of all ages. There are many things you can do to make your home safe and to help prevent falls. What can I do on the outside of my home? Regularly fix the edges of walkways and driveways and fix any cracks. Remove anything that might make you trip as you walk through a door, such as a raised step or threshold. Trim any bushes or trees on the path to your home. Use bright outdoor lighting. Clear any walking paths of anything that might make someone trip, such as rocks or tools. Regularly check to see if handrails are loose or broken. Make sure that both sides of any steps have handrails. Any raised decks and porches should have guardrails on the edges. Have any leaves, snow, or ice cleared regularly. Use sand or salt on walking paths during winter. Clean up any spills in your garage right away. This includes oil or grease spills. What can I do in the bathroom? Use night lights. Install grab bars by the toilet and in the tub and shower. Do not use towel bars as grab bars. Use non-skid mats or decals in the tub or shower. If you  need to sit down in the shower, use a plastic, non-slip stool. Keep the floor dry. Clean up any water that spills on the floor as soon as it happens. Remove soap buildup in the tub or shower regularly. Attach bath mats securely with double-sided non-slip rug tape. Do not have throw rugs and other things on the floor that can make you trip. What can I do in the bedroom? Use night lights. Make sure that you have a light by your bed that is easy to reach. Do not use any sheets or blankets that are too big for your bed. They should not hang down onto the floor. Have a firm chair that has side arms. You can use this for support while you get dressed. Do not have throw rugs and other things on the floor that can make you trip. What can I do in the kitchen? Clean up any spills right away. Avoid walking on wet floors. Keep items that you use a lot in easy-to-reach places. If you need to reach something above you, use a strong step stool that has a grab bar. Keep electrical cords out of the way. Do not use floor  polish or wax that makes floors slippery. If you must use wax, use non-skid floor wax. Do not have throw rugs and other things on the floor that can make you trip. What can I do with my stairs? Do not leave any items on the stairs. Make sure that there are handrails on both sides of the stairs and use them. Fix handrails that are broken or loose. Make sure that handrails are as long as the stairways. Check any carpeting to make sure that it is firmly attached to the stairs. Fix any carpet that is loose or worn. Avoid having throw rugs at the top or bottom of the stairs. If you do have throw rugs, attach them to the floor with carpet tape. Make sure that you have a light switch at the top of the stairs and the bottom of the stairs. If you do not have them, ask someone to add them for you. What else can I do to help prevent falls? Wear shoes that: Do not have high heels. Have rubber  bottoms. Are comfortable and fit you well. Are closed at the toe. Do not wear sandals. If you use a stepladder: Make sure that it is fully opened. Do not climb a closed stepladder. Make sure that both sides of the stepladder are locked into place. Ask someone to hold it for you, if possible. Clearly mark and make sure that you can see: Any grab bars or handrails. First and last steps. Where the edge of each step is. Use tools that help you move around (mobility aids) if they are needed. These include: Canes. Walkers. Scooters. Crutches. Turn on the lights when you go into a dark area. Replace any light bulbs as soon as they burn out. Set up your furniture so you have a clear path. Avoid moving your furniture around. If any of your floors are uneven, fix them. If there are any pets around you, be aware of where they are. Review your medicines with your doctor. Some medicines can make you feel dizzy. This can increase your chance of falling. Ask your doctor what other things that you can do to help prevent falls. This information is not intended to replace advice given to you by your health care provider. Make sure you discuss any questions you have with your health care provider. Document Released: 08/01/2009 Document Revised: 03/12/2016 Document Reviewed: 11/09/2014 Elsevier Interactive Patient Education  2017 [employer].

## 2021-05-19 NOTE — Progress Notes (Signed)
Subjective:   Kelly Norris is a 70 y.o. female who presents for Medicare Annual (Subsequent) preventive examination.  Review of Systems    No ROS.  Medicare Wellness Virtual Visit.  Visual/audio telehealth visit, UTA vital signs.   See social history for additional risk factors.         Objective:    Today's Vitals   05/19/21 1538  Weight: 130 lb (59 kg)  Height: 5' 0.98" (1.549 m)   Body mass index is 24.58 kg/m.  Advanced Directives 05/19/2021 05/15/2020 12/25/2016 05/09/2015  Does Patient Have a Medical Advance Directive? No No No No  Would patient like information on creating a medical advance directive? No - Patient declined No - Patient declined - No - patient declined information    Current Medications (verified) Outpatient Encounter Medications as of 05/19/2021  Medication Sig   cyanocobalamin 1000 MCG tablet Take 1,000 mcg by mouth every other day.   fish oil-omega-3 fatty acids 1000 MG capsule Take 1 capsule by mouth daily. Take 1200 mg/day   FLUoxetine (PROZAC) 20 MG capsule Take 1 capsule (20 mg total) by mouth every morning.   Ginkgo Biloba 40 MG TABS Take by mouth.   losartan (COZAAR) 50 MG tablet Take 50 mg by mouth daily.   Multiple Vitamins-Minerals (WOMENS 50+ ADVANCED PO) Take 1 tablet by mouth daily.    Probiotic Product (SOLUBLE FIBER/PROBIOTICS PO) Take by mouth.   triamterene-hydrochlorothiazide (MAXZIDE-25) 37.5-25 MG tablet Take 1 tablet by mouth daily. (Patient not taking: Reported on 04/25/2021)   No facility-administered encounter medications on file as of 05/19/2021.    Allergies (verified) Epinephrine, Formaldehyde, Metrogel [metronidazole], Norvasc [amlodipine], and Other   History: Past Medical History:  Diagnosis Date   Adenomatous colon polyp    Allergy    Colitis 2018   at periappendiceal orifice   Depression    Diverticulosis    Endometriosis 1994   H/O breast biopsy    x 3   H/O hydronephrosis    left    Hypertension     2004   Kidney trouble    previous stent in the left kidney   Osteoarthritis    cervical    Paresthesias    thumb   Seasonal allergies    Past Surgical History:  Procedure Laterality Date   ABDOMINAL HYSTERECTOMY  1994   Partial (does not have cervix); endometriosis   COLONOSCOPY WITH PROPOFOL N/A 12/25/2016   Procedure: COLONOSCOPY WITH PROPOFOL;  Surgeon: Lollie Sails, MD;  Location: [facility] ENDOSCOPY;  Service: Endoscopy;  Laterality: N/A;   DILATION AND CURETTAGE OF UTERUS  92-94   2 times between 92-94   URETERAL STENT PLACEMENT     Family History  Problem Relation Age of Onset   Hypertension Mother    Breast cancer Mother        age 96   Colon polyps Mother    Depression Mother    Colitis Mother    Hypertension Father    Prostate cancer Father    Heart disease Father        bypass surgery in his late 48 's   Colon polyps Father    Colitis Father    Colitis Sister    Other Sister        stomach polyps   Rheum arthritis Sister    Colon polyps Brother    Colitis Brother    Rheum arthritis Brother    Colitis Brother    Rheum arthritis Brother  Valvular heart disease Daughter        valvular heart disease bacterial endocarditis but no premature history  of cardiac  disease   Valvular heart disease Daughter    Social History   Socioeconomic History   Marital status: Divorced    Spouse name: Not on file   Number of children: 2   Years of education: 16   Highest education level: Bachelor's degree (e.g., BA, AB, BS)  Occupational History    Employer: [employer]  Tobacco Use   Smoking status: Former    Years: 20.00    Types: Cigarettes    Quit date: 10/18/2013    Years since quitting: 7.5   Smokeless tobacco: Never   Tobacco comments:    was smoking about 5 cigarettes daily  Vaping Use   Vaping Use: Never used  Substance and Sexual Activity   Alcohol use: No   Drug use: No   Sexual activity: Not on file  Other Topics Concern   Not on file  Social  History Narrative         Social Determinants of Health   Financial Resource Strain: Low Risk    Difficulty of Paying Living Expenses: Not hard at all  Food Insecurity: No Food Insecurity   Worried About Charity fundraiser in the Last Year: Never true   [place] in the Last Year: Never true  Transportation Needs: No Transportation Needs   Lack of Transportation (Medical): No   Lack of Transportation (Non-Medical): No  Physical Activity: Not on file  Stress: No Stress Concern Present   Feeling of Stress : Not at all  Social Connections: Unknown   Frequency of Communication with Friends and Family: More than three times a week   Frequency of Social Gatherings with Friends and Family: More than three times a week   Attends Religious Services: Not on Electrical engineer or Organizations: Not on file   Attends Archivist Meetings: Not on file   Marital Status: Not on file    Tobacco Counseling Counseling given: Not Answered Tobacco comments: was smoking about 5 cigarettes daily   Clinical Intake:  Pre-visit preparation completed: Yes        Diabetes: No  How often do you need to have someone help you when you read instructions, pamphlets, or other written materials from your doctor or pharmacy?: 1 - Never  Interpreter Needed?: No      Activities of Daily Living In your present state of health, do you have any difficulty performing the following activities: 05/19/2021  Hearing? N  Vision? N  Difficulty concentrating or making decisions? N  Walking or climbing stairs? N  Dressing or bathing? N  Doing errands, shopping? N  Preparing Food and eating ? N  Using the Toilet? N  In the past six months, have you accidently leaked urine? N  Do you have problems with loss of bowel control? (No Data)  Comment Followed by GI and pcp.  Managing your Medications? N  Managing your Finances? N  Housekeeping or managing your Housekeeping? N  Some  recent data might be hidden    Patient Care Team: Burnard Hawthorne, FNP as PCP - General (Family Medicine)  Indicate any recent Medical Services you may have received from other than [facility] providers in the past year (date may be approximate).     Assessment:   This is a routine wellness examination for Kelly Norris.  I connected  with Aryss today by telephone and verified that I am speaking with the correct person using two identifiers. Location patient: home Location provider: work Persons participating in the virtual visit: patient, Marine scientist.    I discussed the limitations, risks, security and privacy concerns of performing an evaluation and management service by telephone and the availability of in person appointments. The patient expressed understanding and verbally consented to this telephonic visit.    Interactive audio and video telecommunications were attempted between this provider and patient, however failed, due to patient having technical difficulties OR patient did not have access to video capability.  We continued and completed visit with audio only.  Some vital signs may be absent or patient reported.   Hearing/Vision screen Hearing Screening - Comments:: Patient is able to hear conversational tones without difficulty.  No issues reported.   Vision Screening - Comments:: Followed by Dr. Laurence Aly  Wears corrective lenses  They have seen their ophthalmologist in the last 12 months.   Dietary issues and exercise activities discussed:   Regular diet Good water intake   Goals Addressed               This Visit's Progress     Patient Stated     Increase physical activity (pt-stated)        I would love to walk more for exercise as tolerated       Depression Screen PHQ 2/9 Scores 04/25/2021 06/05/2020 05/15/2020 01/23/2020 11/29/2019 08/03/2019 01/24/2018  PHQ - 2 Score 1 0 0 2 0 0 1  PHQ- 9 Score - - - 5 - - -    Fall Risk Fall Risk  05/19/2021 04/25/2021 06/05/2020  05/15/2020 03/06/2020  Falls in the past year? 0 0 0 0 0  Number falls in past yr: 0 0 0 0 -  Injury with Fall? 0 - 0 - -  Follow up Falls evaluation completed Falls evaluation completed Falls evaluation completed Falls evaluation completed Falls evaluation completed    [place]: Adequate lighting in your home to reduce risk of falls? Yes   ASSISTIVE DEVICES UTILIZED TO PREVENT FALLS: Use of a cane, walker or w/c? No   TIMED UP AND GO: Was the test performed? No .   Cognitive Function: Patient is alert and oriented x3.  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  MMSE/6CIT deferred. Normal by direct communication/observation.   MMSE - Mini Mental State Exam 05/15/2020  Not completed: Unable to complete        Immunizations Immunization History  Administered Date(s) Administered   Fluad Quad(high Dose 65+) 08/03/2019, 12/06/2020   Influenza, High Dose Seasonal PF 08/10/2018   Influenza,inj,Quad PF,6+ Mos 07/10/2016   PFIZER(Purple Top)SARS-COV-2 Vaccination 12/09/2019, 01/03/2020, 08/04/2020, 03/23/2021   Pneumococcal Conjugate-13 03/02/2018   Pneumococcal Polysaccharide-23 03/20/2019   Tdap 03/02/2018   Zoster, Live 07/10/2016   Shingrix  vaccine- deferred.   Health Maintenance Health Maintenance  Topic Date Due   INFLUENZA VACCINE  07/05/2021 (Originally 05/19/2021)   Zoster Vaccines- Shingrix (1 of 2) 08/19/2021 (Originally 02/20/2003)   MAMMOGRAM  05/19/2022 (Originally 12/13/2009)   DEXA SCAN  05/19/2022 (Originally 02/19/2018)   Hepatitis C Screening  05/19/2022 (Originally 02/20/1969)   COLONOSCOPY (Pts 45-35yr Insurance coverage will need to be confirmed)  12/25/2021   TETANUS/TDAP  03/02/2028   COVID-19 Vaccine  Completed   PNA vac Low Risk Adult  Completed   HPV VACCINES  Aged Out   Mammogram- deferred per patient preference.  Bone density-deferred per patient preference.   Lung Cancer Screening: (Low Dose CT Chest  recommended if Age 54-80 years, 30 pack-year currently smoking OR have quit w/in 15years.) does not qualify.   Hepatitis C Screening: deferred.   Vision Screening: Recommended annual ophthalmology exams for early detection of glaucoma and other disorders of the eye.  Dental Screening: Recommended annual dental exams for proper oral hygiene.  Community Resource Referral / Chronic Care Management: CRR required this visit?  No   CCM required this visit?  No      Plan:   Keep all routine maintenance appointments.   I have personally reviewed and noted the following in the patient's chart:   Medical and social history Use of alcohol, tobacco or illicit drugs  Current medications and supplements including opioid prescriptions.  Functional ability and status Nutritional status Physical activity Advanced directives List of other physicians Hospitalizations, surgeries, and ER visits in previous 12 months Vitals Screenings to include cognitive, depression, and falls Referrals and appointments  In addition, I have reviewed and discussed with patient certain preventive protocols, quality metrics, and best practice recommendations. A written personalized care plan for preventive services as well as general preventive health recommendations were provided to patient via mychart.     Varney Biles, LPN   [id number]

## 2021-05-20 ENCOUNTER — Other Ambulatory Visit: Payer: Self-pay

## 2021-05-20 ENCOUNTER — Encounter: Payer: Self-pay | Admitting: Gastroenterology

## 2021-05-20 ENCOUNTER — Ambulatory Visit: Payer: Medicare Other | Admitting: Gastroenterology

## 2021-05-20 VITALS — BP 119/68 | HR 73 | Temp 98.8°F | Ht 60.98 in | Wt 128.0 lb

## 2021-05-20 DIAGNOSIS — R110 Nausea: Secondary | ICD-10-CM | POA: Diagnosis not present

## 2021-05-20 DIAGNOSIS — Z86010 Personal history of colonic polyps: Secondary | ICD-10-CM

## 2021-05-20 DIAGNOSIS — R140 Abdominal distension (gaseous): Secondary | ICD-10-CM

## 2021-05-20 DIAGNOSIS — K529 Noninfective gastroenteritis and colitis, unspecified: Secondary | ICD-10-CM

## 2021-05-20 DIAGNOSIS — K862 Cyst of pancreas: Secondary | ICD-10-CM

## 2021-05-20 MED ORDER — OMEPRAZOLE 40 MG PO CPDR: 40.0000 mg | DELAYED_RELEASE_CAPSULE | Freq: Every day | ORAL | 0 refills | Status: DC

## 2021-05-20 NOTE — Patient Instructions (Signed)
Low-FODMAP Eating Plan °FODMAP stands for fermentable oligosaccharides, disaccharides, monosaccharides, and polyols. These are sugars that are hard for some people to digest. A low-FODMAP eating plan may help some people who have irritable bowel syndrome (IBS) and certain other bowel (intestinal) diseases to manage their symptoms. °This meal plan can be complicated to follow. Work with a diet and nutrition specialist (dietitian) to make a low-FODMAP eating plan that is right for you. A dietitian can help make sure that you get enough nutrition from this diet. °What are tips for following this plan? °Reading food labels °Check labels for hidden FODMAPs such as: °High-fructose syrup. °Honey. °Agave. °Natural fruit flavors. °Onion or garlic powder. °Choose low-FODMAP foods that contain 3-4 grams of fiber per serving. °Check food labels for serving sizes. Eat only one serving at a time to make sure FODMAP levels stay low. °Shopping °Shop with a list of foods that are recommended on this diet and make a meal plan. °Meal planning °Follow a low-FODMAP eating plan for up to 6 weeks, or as told by your health care provider or dietitian. °To follow the eating plan: °Eliminate high-FODMAP foods from your diet completely. Choose only low-FODMAP foods to eat. You will do this for 2-6 weeks. °Gradually reintroduce high-FODMAP foods into your diet one at a time. Most people should wait a few days before introducing the next new high-FODMAP food into their meal plan. Your dietitian can recommend how quickly you may reintroduce foods. °Keep a daily record of what and how much you eat and drink. Make note of any symptoms that you have after eating. °Review your daily record with a dietitian regularly to identify which foods you can eat and which foods you should avoid. °General tips °Drink enough fluid each day to keep your urine pale yellow. °Avoid processed foods. These often have added sugar and may be high in FODMAPs. °Avoid most  dairy products, whole grains, and sweeteners. °Work with a dietitian to make sure you get enough fiber in your diet. °Avoid high FODMAP foods at meals to manage symptoms. °Recommended foods °Fruits °Bananas, oranges, tangerines, lemons, limes, blueberries, raspberries, strawberries, grapes, cantaloupe, honeydew melon, kiwi, papaya, passion fruit, and pineapple. Limited amounts of dried cranberries, banana chips, and shredded coconut. °Vegetables °Eggplant, zucchini, cucumber, peppers, green beans, bean sprouts, lettuce, arugula, kale, Swiss chard, spinach, collard greens, bok choy, summer squash, potato, and tomato. Limited amounts of corn, carrot, and sweet potato. Green parts of scallions. °Grains °Gluten-free grains, such as rice, oats, buckwheat, quinoa, corn, polenta, and millet. Gluten-free pasta, bread, or cereal. Rice noodles. Corn tortillas. °Meats and other proteins °Unseasoned beef, pork, poultry, or fish. Eggs. Bacon. Tofu (firm) and tempeh. Limited amounts of nuts and seeds, such as almonds, walnuts, brazil nuts, pecans, peanuts, nut butters, pumpkin seeds, chia seeds, and sunflower seeds. °Dairy °Lactose-free milk, yogurt, and kefir. Lactose-free cottage cheese and ice cream. Non-dairy milks, such as almond, coconut, hemp, and rice milk. Non-dairy yogurt. Limited amounts of goat cheese, brie, mozzarella, parmesan, swiss, and other hard cheeses. °Fats and oils °Butter-free spreads. Vegetable oils, such as olive, canola, and sunflower oil. °Seasoning and other foods °Artificial sweeteners with names that do not end in "ol," such as aspartame, saccharine, and stevia. Maple syrup, white table sugar, raw sugar, brown sugar, and molasses. Mayonnaise, soy sauce, and tamari. Fresh basil, coriander, parsley, rosemary, and thyme. °Beverages °Water and mineral water. Sugar-sweetened soft drinks. Small amounts of orange juice or cranberry juice. Black and green tea. Most dry wines. Coffee. °  The items listed above  may not be a complete list of foods and beverages you can eat. Contact a dietitian for more information. °Foods to avoid °Fruits °Fresh, dried, and juiced forms of apple, pear, watermelon, peach, plum, cherries, apricots, blackberries, boysenberries, figs, nectarines, and mango. Avocado. °Vegetables °Chicory root, artichoke, asparagus, cabbage, snow peas, Brussels sprouts, broccoli, sugar snap peas, mushrooms, celery, and cauliflower. Onions, garlic, leeks, and the white part of scallions. °Grains °Wheat, including kamut, durum, and semolina. Barley and bulgur. Couscous. Wheat-based cereals. Wheat noodles, bread, crackers, and pastries. °Meats and other proteins °Fried or fatty meat. Sausage. Cashews and pistachios. Soybeans, baked beans, black beans, chickpeas, kidney beans, fava beans, navy beans, lentils, black-eyed peas, and split peas. °Dairy °Milk, yogurt, ice cream, and soft cheese. Cream and sour cream. Milk-based sauces. Custard. Buttermilk. Soy milk. °Seasoning and other foods °Any sugar-free gum or candy. Foods that contain artificial sweeteners such as sorbitol, mannitol, isomalt, or xylitol. Foods that contain honey, high-fructose corn syrup, or agave. Bouillon, vegetable stock, beef stock, and chicken stock. Garlic and onion powder. Condiments made with onion, such as hummus, chutney, pickles, relish, salad dressing, and salsa. Tomato paste. °Beverages °Chicory-based drinks. Coffee substitutes. Chamomile tea. Fennel tea. Sweet or fortified wines such as port or sherry. Diet soft drinks made with isomalt, mannitol, maltitol, sorbitol, or xylitol. Apple, pear, and mango juice. Juices with high-fructose corn syrup. °The items listed above may not be a complete list of foods and beverages you should avoid. Contact a dietitian for more information. °Summary °FODMAP stands for fermentable oligosaccharides, disaccharides, monosaccharides, and polyols. These are sugars that are hard for some people to  digest. °A low-FODMAP eating plan is a short-term diet that helps to ease symptoms of certain bowel diseases. °The eating plan usually lasts up to 6 weeks. After that, high-FODMAP foods are reintroduced gradually and one at a time. This can help you find out which foods may be causing symptoms. °A low-FODMAP eating plan can be complicated. It is best to work with a dietitian who has experience with this type of plan. °This information is not intended to replace advice given to you by your health care provider. Make sure you discuss any questions you have with your health care provider. °Document Revised: 02/22/2020 Document Reviewed: 02/22/2020 °Elsevier Patient Education © 2022 [employer] Inc[employer] ° °

## 2021-05-20 NOTE — Progress Notes (Signed)
Jonathon Bellows MD, MRCP(U.K) [address]  [place]  [place], [place] [address]  Main: [phone]  Fax: [phone]   Gastroenterology Consultation  Referring Provider:     Burnard Hawthorne, FNP Primary Care Physician:  Burnard Hawthorne, FNP Primary Gastroenterologist:  Dr. Jonathon Bellows  Reason for Consultation:     Diarrhea        HPI:   Kelly Norris is a 70 y.o. y/o female referred for consultation & management  by Burnard Hawthorne, FNP.    Prior GI notes reviewed and summarized   She has been referred to see me for diarrhea.  Previously seen [facility] gastroenterology back in June 2019 for colitis.  As per the note colonoscopy in 2018 had a few adenomatous polyps and some inflammation in the periappendicular area.  The colitis was active no mention of chronicity it appears she has been on mesalamine.  At that point of time biopsies were also taken for microscopic colitis which were negative.  Subsequently stopped.  At that point of time their plan was to refer to [facility] or [facility] for further evaluation but there was no clear plan of impression.  CRP was 49 at that point of time.  Subsequently she saw Dr. Hilarie Fredrickson in July 2020 and his impression was IBS type of diarrhea.  In April 2022 C. difficile testing was negative, GI PCR testing was also negative..  CT abdomen in May 2022 showed 9 mm cystic lesion in the pancreas near the junction of the body and tail MRI with and without contrast recommended a subsequent follow-up with MRI showed 11 mm unilocular cystic lesion pancreatic tail suspicious for cystic neoplasm such as sidebranch IPMN recommend follow-up in 2 years.  04/24/2021 BMP shows no gross abnormalities.  She states that she has had diarrhea for many years.  Usually in the daytime.  3-4 bowel movements per day.  Watery mucus.  No blood.  Denies any NSAID use.  Uses probiotics daily.  Uses sweetened low daily.  Some diet tea daily.  Lots of bloating abdominal  distention flatulence which is foul-smelling.  She says she has been tested for celiac disease in the past and was negative.  She works as a Optometrist.   She also has been complaining of nausea without any abdominal pain Past Medical History:  Diagnosis Date   Adenomatous colon polyp    Allergy    Colitis 2018   at periappendiceal orifice   Depression    Diverticulosis    Endometriosis 1994   H/O breast biopsy    x 3   H/O hydronephrosis    left    Hypertension    2004   Kidney trouble    previous stent in the left kidney   Osteoarthritis    cervical    Paresthesias    thumb   Seasonal allergies     Past Surgical History:  Procedure Laterality Date   ABDOMINAL HYSTERECTOMY  1994   Partial (does not have cervix); endometriosis   COLONOSCOPY WITH PROPOFOL N/A 12/25/2016   Procedure: COLONOSCOPY WITH PROPOFOL;  Surgeon: Lollie Sails, MD;  Location: [facility] ENDOSCOPY;  Service: Endoscopy;  Laterality: N/A;   DILATION AND CURETTAGE OF UTERUS  92-94   2 times between 92-94   URETERAL STENT PLACEMENT      Prior to Admission medications   Medication Sig Start Date End Date Taking? Authorizing Provider  cyanocobalamin 1000 MCG tablet Take 1,000 mcg by mouth every other day.  [provider]  fish oil-omega-3 fatty acids 1000 MG capsule Take 1 capsule by mouth daily. Take 1200 mg/day    [provider]  FLUoxetine (PROZAC) 20 MG capsule Take 1 capsule (20 mg total) by mouth every morning. 01/29/21   Burnard Hawthorne, FNP  Ginkgo Biloba 40 MG TABS Take by mouth.    [provider]  losartan (COZAAR) 50 MG tablet Take 50 mg by mouth daily. 01/02/21   [provider]  Multiple Vitamins-Minerals (WOMENS 50+ ADVANCED PO) Take 1 tablet by mouth daily.     [provider]  Probiotic Product (SOLUBLE FIBER/PROBIOTICS PO) Take by mouth.    [provider]  triamterene-hydrochlorothiazide (MAXZIDE-25) 37.5-25 MG tablet Take 1 tablet  by mouth daily. Patient not taking: Reported on 04/25/2021 04/11/21   Burnard Hawthorne, FNP    Family History  Problem Relation Age of Onset   Hypertension Mother    Breast cancer Mother        age 2   Colon polyps Mother    Depression Mother    Colitis Mother    Hypertension Father    Prostate cancer Father    Heart disease Father        bypass surgery in his late 46 's   Colon polyps Father    Colitis Father    Colitis Sister    Other Sister        stomach polyps   Rheum arthritis Sister    Colon polyps Brother    Colitis Brother    Rheum arthritis Brother    Colitis Brother    Rheum arthritis Brother    Valvular heart disease Daughter        valvular heart disease bacterial endocarditis but no premature history  of cardiac  disease   Valvular heart disease Daughter      Social History   Tobacco Use   Smoking status: Former    Years: 20.00    Types: Cigarettes    Quit date: 10/18/2013    Years since quitting: 7.5   Smokeless tobacco: Never   Tobacco comments:    was smoking about 5 cigarettes daily  Vaping Use   Vaping Use: Never used  Substance Use Topics   Alcohol use: No   Drug use: No    Allergies as of 05/20/2021 - Review Complete 05/20/2021  Allergen Reaction Noted   Epinephrine Other (See Comments) 03/31/2012   Formaldehyde Hives and Other (See Comments) 07/19/2012   Metrogel [metronidazole] Other (See Comments) 12/24/2016   Norvasc [amlodipine]  03/12/2021   Other  05/04/2019    Review of Systems:    All systems reviewed and negative except where noted in HPI.   Physical Exam:  BP 119/68   Pulse 73   Temp 98.8 F (37.1 C) (Oral)   Ht 5' 0.98" (1.549 m)   Wt 128 lb (58.1 kg)   BMI 24.20 kg/m  No LMP recorded. Patient has had a hysterectomy. Psych:  Alert and cooperative. Normal mood and affect. General:   Alert,  Well-developed, well-nourished, pleasant and cooperative in NAD Head:  Normocephalic and atraumatic. Eyes:  Sclera clear,  no icterus.   Conjunctiva pink. Ears:  Normal auditory acuity. Lungs:  Respirations even and unlabored.  Clear throughout to auscultation.   No wheezes, crackles, or rhonchi. No acute distress. Heart:  Regular rate and rhythm; no murmurs, clicks, rubs, or gallops. Abdomen:  Normal bowel sounds.  No bruits.  Soft, non-tender and non-distended without  masses, hepatosplenomegaly or hernias noted.  No guarding or rebound tenderness.    Neurologic:  Alert and oriented x3;  grossly normal neurologically. Psych:  Alert and cooperative. Normal mood and affect.  Imaging Studies: No results found.  Assessment and Plan:   Kelly Norris is a 70 y.o. y/o female who was previously seen by [facility] clinic gastroenterology in 2019 then shifted care to Dr. Hilarie Fredrickson in 2020 and now to see me as a third gastroenterologist.  However for diarrhea.  Prior colonoscopy in 2018 showed no evidence of microscopic colitis or inflammatory bowel disease.  Recent CT scan of the abdomen shows no inflammation anywhere.  She has a pancreatic cyst which is probably a sidebranch IPMN and recommended to have a repeat MRI in 2 years.  Differential diagnosis for diarrhea include osmotic diarrhea versus IBS diarrhea versus pancreatic insufficiency  Plan 1.  Low FODMAP diet, stop all artificial sugars and sweeteners.  If does not work at next visit we will try a course of Creon and if that also does not work we will give her a course of Xifaxan for IBS diarrhea.  I have asked her to stop using all probiotics. 2.   Will require surveillance colonoscopy in January 2023 reminder will be placed in system 3.  MRI of the pancreas in 2023 March April 4.  For her nausea empiric trial of PPI for 4 to 6 weeks.  If no better after that we will evaluate her gallbladder Follow up in 3 to 4 months  Dr Jonathon Bellows MD,MRCP(U.K)

## 2021-06-13 ENCOUNTER — Ambulatory Visit: Payer: Medicare Other | Admitting: Family

## 2021-07-28 ENCOUNTER — Ambulatory Visit: Payer: Medicare Other | Admitting: Family

## 2021-08-11 ENCOUNTER — Ambulatory Visit (INDEPENDENT_AMBULATORY_CARE_PROVIDER_SITE_OTHER): Payer: Medicare Other | Admitting: Family

## 2021-08-11 ENCOUNTER — Encounter: Payer: Self-pay | Admitting: Family

## 2021-08-11 ENCOUNTER — Other Ambulatory Visit: Payer: Self-pay

## 2021-08-11 VITALS — BP 120/82 | HR 74 | Temp 98.8°F | Ht 60.98 in | Wt 124.0 lb

## 2021-08-11 DIAGNOSIS — I10 Essential (primary) hypertension: Secondary | ICD-10-CM | POA: Diagnosis not present

## 2021-08-11 DIAGNOSIS — Z23 Encounter for immunization: Secondary | ICD-10-CM

## 2021-08-11 DIAGNOSIS — R1084 Generalized abdominal pain: Secondary | ICD-10-CM

## 2021-08-11 DIAGNOSIS — K8689 Other specified diseases of pancreas: Secondary | ICD-10-CM | POA: Diagnosis not present

## 2021-08-11 LAB — BASIC METABOLIC PANEL
BUN: 14 mg/dL (ref 6–23)
CO2: 31 mEq/L (ref 19–32)
Calcium: 9.9 mg/dL (ref 8.4–10.5)
Chloride: 99 mEq/L (ref 96–112)
Creatinine, Ser: 0.81 mg/dL (ref 0.40–1.20)
GFR: 74.56 mL/min (ref >60.00)
Glucose, Bld: 101 mg/dL — ABNORMAL HIGH (ref 70–99)
Potassium: 3.1 mEq/L — ABNORMAL LOW (ref 3.5–5.1)
Sodium: 140 mEq/L (ref 135–145)

## 2021-08-11 NOTE — Progress Notes (Signed)
Subjective:    Patient ID: Kelly Norris, female    DOB: July 20, 1951, 70 y.o.   MRN: [id number]  CC: Kelly Norris is a 70 y.o. female who presents today for follow up.   HPI: Feels well today.  She is moving to [place] which is always been treatment first.  She has no new complaints.    Diarrhea waxes and wanes.  She tried FODMAP diet, limiting artificial sugars without improvement of symptoms   Interestingly, diarrhea completely resolves when she has been traveling  She has not started prilosec or creon from dr Vicente Males.   NO fever, vomiting, epigastric burning, hematuria, dysuria.    She has left lower abdominal pain. Pain is mild, not bothersome, nor migratory.   HTN- compliant with triamterene-hydrochlorothiazide.  Denies chest pain, shortness of breath.  Depression - feels on prozac 20mg   Chronic diarrhea - consult with Dr Vicente Males 05/20/21.  Including differential diagnosis osmotic diarrhea versus IBS versus pancreatic insufficiency.  Advised low FODMAP diet, stop official sugars and sweeteners.  Consider course of Creon.  Colonoscopy due January 2023.  MRI pancreas due 2023 March, April Started on empiric trial of PPI  Declines mammogram.  History of hysterectomy  HISTORY:  Past Medical History:  Diagnosis Date   Adenomatous colon polyp    Allergy    Colitis 2018   at periappendiceal orifice   Depression    Diverticulosis    Endometriosis 1994   H/O breast biopsy    x 3   H/O hydronephrosis    left    Hypertension    2004   Kidney trouble    previous stent in the left kidney   Osteoarthritis    cervical    Paresthesias    thumb   Seasonal allergies    Past Surgical History:  Procedure Laterality Date   ABDOMINAL HYSTERECTOMY  1994   Partial (does not have cervix); endometriosis   COLONOSCOPY WITH PROPOFOL N/A 12/25/2016   Procedure: COLONOSCOPY WITH PROPOFOL;  Surgeon: Lollie Sails, MD;  Location: [facility] ENDOSCOPY;  Service: Endoscopy;   Laterality: N/A;   DILATION AND CURETTAGE OF UTERUS  92-94   2 times between 92-94   URETERAL STENT PLACEMENT     Family History  Problem Relation Age of Onset   Hypertension Mother    Breast cancer Mother        age 53   Colon polyps Mother    Depression Mother    Colitis Mother    Hypertension Father    Prostate cancer Father    Heart disease Father        bypass surgery in his late 74 's   Colon polyps Father    Colitis Father    Colitis Sister    Other Sister        stomach polyps   Rheum arthritis Sister    Colon polyps Brother    Colitis Brother    Rheum arthritis Brother    Colitis Brother    Rheum arthritis Brother    Valvular heart disease Daughter        valvular heart disease bacterial endocarditis but no premature history  of cardiac  disease   Valvular heart disease Daughter     Allergies: Epinephrine, Formaldehyde, Metrogel [metronidazole], Norvasc [amlodipine], and Other Current Outpatient Medications on File Prior to Visit  Medication Sig Dispense Refill   fish oil-omega-3 fatty acids 1000 MG capsule Take 1 capsule by mouth daily. Take 1200 mg/day     FLUoxetine (  PROZAC) 20 MG capsule Take 1 capsule (20 mg total) by mouth every morning. 90 capsule 3   Ginkgo Biloba 40 MG TABS Take by mouth.     Multiple Vitamins-Minerals (WOMENS 50+ ADVANCED PO) Take 1 tablet by mouth daily.      Probiotic Product (SOLUBLE FIBER/PROBIOTICS PO) Take by mouth.     triamterene-hydrochlorothiazide (MAXZIDE-25) 37.5-25 MG tablet Take 1 tablet by mouth daily. 90 tablet 3   omeprazole (PRILOSEC) 40 MG capsule Take 1 capsule (40 mg total) by mouth daily. Take for 6 weeks and then stop. (Patient not taking: Reported on 08/11/2021) 50 capsule 0   No current facility-administered medications on file prior to visit.    Social History   Tobacco Use   Smoking status: Former    Years: 20.00    Types: Cigarettes    Quit date: 10/18/2013    Years since quitting: 7.8   Smokeless  tobacco: Never   Tobacco comments:    was smoking about 5 cigarettes daily  Vaping Use   Vaping Use: Never used  Substance Use Topics   Alcohol use: No   Drug use: No    Review of Systems  Constitutional:  Negative for chills and fever.  Respiratory:  Negative for cough.   Cardiovascular:  Negative for chest pain and palpitations.  Gastrointestinal:  Positive for abdominal pain and diarrhea. Negative for constipation, nausea and vomiting.  Psychiatric/Behavioral:  The patient is not nervous/anxious.      Objective:    BP 120/82 (BP Location: Left Arm, Patient Position: Sitting, Cuff Size: Normal)   Pulse 74   Temp 98.8 F (37.1 C) (Oral)   Ht 5' 0.98" (1.549 m)   Wt 124 lb (56.2 kg)   SpO2 98%   BMI 23.44 kg/m  BP Readings from Last 3 Encounters:  08/11/21 120/82  05/20/21 119/68  04/25/21 130/80   Wt Readings from Last 3 Encounters:  08/11/21 124 lb (56.2 kg)  05/20/21 128 lb (58.1 kg)  05/19/21 130 lb (59 kg)    Physical Exam Vitals reviewed.  Constitutional:      Appearance: Normal appearance. She is well-developed.  Eyes:     Conjunctiva/sclera: Conjunctivae normal.  Cardiovascular:     Rate and Rhythm: Normal rate and regular rhythm.     Pulses: Normal pulses.     Heart sounds: Normal heart sounds.  Pulmonary:     Effort: Pulmonary effort is normal.     Breath sounds: Normal breath sounds. No wheezing, rhonchi or rales.  Abdominal:     General: Bowel sounds are normal. There is no distension.     Palpations: Abdomen is soft. Abdomen is not rigid. There is no fluid wave or mass.     Tenderness: There is no abdominal tenderness. There is no guarding or rebound.  Skin:    General: Skin is warm and dry.  Neurological:     Mental Status: She is alert.  Psychiatric:        Speech: Speech normal.        Behavior: Behavior normal.        Thought Content: Thought content normal.       Assessment & Plan:   Problem List Items Addressed This Visit        Cardiovascular and Mediastinum   Hypertension - Primary    Blood pressure well controlled today.  History of hypokalemia, complicated by diarrhea.  Pending BMP as now patient is on triamterene-hydrochlorothiazide.      Relevant Orders  Basic metabolic panel     Other   Generalized abdominal pain    Patient describes abdominal pain is more left lower sided today.  Reassuring abdominal exam today.  Advised CT abdomen and pelvis to ensure diverticulitis or other abdominal pathology, including ovarian.  She declines as abdominal pain is not bothersome for her.  She does not want any further work-up for this.  She will certainly call to let me know if she were to desire further work up.       Pancreatic mass    Upon patient moving to [place] this year, reiterated the importance of surveillance of pancreatic lesion as she has been following with Dr. Vicente Males.  She understands t to establish care with gastroenterology in [place] or she may travel back to visit with Dr. Vicente Males.  Patient verbalized understanding of all      Other Visit Diagnoses     Needs flu shot       Relevant Orders   Flu Vaccine QUAD High Dose(Fluad) (Completed)        I am having Luna Fuse maintain her fish oil-omega-3 fatty acids, Multiple Vitamins-Minerals (WOMENS 50+ ADVANCED PO), Ginkgo Biloba, Probiotic Product (SOLUBLE FIBER/PROBIOTICS PO), FLUoxetine, triamterene-hydrochlorothiazide, and omeprazole.   No orders of the defined types were placed in this encounter.   Return precautions given.   Risks, benefits, and alternatives of the medications and treatment plan prescribed today were discussed, and patient expressed understanding.   Education regarding symptom management and diagnosis given to patient on AVS.  Continue to follow with Burnard Hawthorne, FNP for routine health maintenance.   Luna Fuse and I agreed with plan.   Mable Paris, FNP

## 2021-08-11 NOTE — Assessment & Plan Note (Signed)
Patient describes abdominal pain is more left lower sided today.  Reassuring abdominal exam today.  Advised CT abdomen and pelvis to ensure diverticulitis or other abdominal pathology, including ovarian.  She declines as abdominal pain is not bothersome for her.  She does not want any further work-up for this.  She will certainly call to let me know if she were to desire further work up.

## 2021-08-11 NOTE — Assessment & Plan Note (Signed)
Upon patient moving to [place] this year, reiterated the importance of surveillance of pancreatic lesion as she has been following with Dr. Vicente Males.  She understands t to establish care with gastroenterology in [place] or she may travel back to visit with Dr. Vicente Males.  Patient verbalized understanding of all

## 2021-08-11 NOTE — Assessment & Plan Note (Signed)
Blood pressure well controlled today.  History of hypokalemia, complicated by diarrhea.  Pending BMP as now patient is on triamterene-hydrochlorothiazide.

## 2021-08-11 NOTE — Patient Instructions (Addendum)
Colonoscopy due January 2023.  MRI pancreas due 2023 March, April - please ensure you establish with Gastroenterology in [place] come back to see him in person next year.   Let me know if any new symptoms or concerns as it relates to abdominal pain, diarrhea

## 2021-08-12 ENCOUNTER — Other Ambulatory Visit: Payer: Self-pay

## 2021-08-12 DIAGNOSIS — E876 Hypokalemia: Secondary | ICD-10-CM

## 2021-08-13 ENCOUNTER — Other Ambulatory Visit: Payer: Self-pay | Admitting: Family

## 2021-08-13 MED ORDER — POTASSIUM CHLORIDE CRYS ER 20 MEQ PO TBCR
20.0000 meq | EXTENDED_RELEASE_TABLET | Freq: Every day | ORAL | 1 refills | Status: DC
Start: 2021-08-13 — End: 2021-08-22

## 2021-08-21 ENCOUNTER — Other Ambulatory Visit (INDEPENDENT_AMBULATORY_CARE_PROVIDER_SITE_OTHER): Payer: Medicare Other

## 2021-08-21 ENCOUNTER — Other Ambulatory Visit: Payer: Self-pay

## 2021-08-21 DIAGNOSIS — E876 Hypokalemia: Secondary | ICD-10-CM

## 2021-08-21 DIAGNOSIS — I10 Essential (primary) hypertension: Secondary | ICD-10-CM

## 2021-08-22 ENCOUNTER — Other Ambulatory Visit: Payer: Self-pay | Admitting: Family

## 2021-08-22 DIAGNOSIS — I10 Essential (primary) hypertension: Secondary | ICD-10-CM

## 2021-08-22 LAB — BASIC METABOLIC PANEL
BUN: 12 mg/dL (ref 6–23)
CO2: 31 mEq/L (ref 19–32)
Calcium: 9.5 mg/dL (ref 8.4–10.5)
Chloride: 101 mEq/L (ref 96–112)
Creatinine, Ser: 0.9 mg/dL (ref 0.40–1.20)
GFR: 65.69 mL/min (ref >60.00)
Glucose, Bld: 119 mg/dL — ABNORMAL HIGH (ref 70–99)
Potassium: 3.1 mEq/L — ABNORMAL LOW (ref 3.5–5.1)
Sodium: 140 mEq/L (ref 135–145)

## 2021-08-22 LAB — MAGNESIUM: Magnesium: 2.1 mg/dL (ref 1.5–2.5)

## 2021-08-22 MED ORDER — POTASSIUM CHLORIDE 20 MEQ PO PACK: 20.0000 meq | PACK | Freq: Every day | ORAL | 1 refills | Status: AC

## 2021-08-22 MED ORDER — POTASSIUM CHLORIDE CRYS ER 20 MEQ PO TBCR: 20.0000 meq | EXTENDED_RELEASE_TABLET | Freq: Two times a day (BID) | ORAL | 1 refills | Status: DC

## 2021-08-22 NOTE — Progress Notes (Signed)
close

## 2021-08-25 ENCOUNTER — Ambulatory Visit: Payer: Medicare Other | Admitting: Gastroenterology

## 2021-09-07 IMAGING — MR MR ABDOMEN WO/W CM
20 of 22 series · 44 of 48 positions shown · IV contrast (6ml Gadavist)
Comparison: CT on 02/19/2021

CLINICAL DATA: Cystic pancreatic lesion seen on recent CT.
Abdominal pain and chronic diarrhea.

EXAM:
MRI ABDOMEN WITHOUT AND WITH CONTRAST
TECHNIQUE: Multiplanar multisequence MR imaging of the abdomen was performed
both before and after the administration of intravenous contrast.
CONTRAST:  6mL GADAVIST GADOBUTROL 1 MMOL/ML IV SOLN

[Series 2: cor haste · coronal · 6.0mm · 1.48mm/px · 2 of 26 slices shown]
[im 1/26]
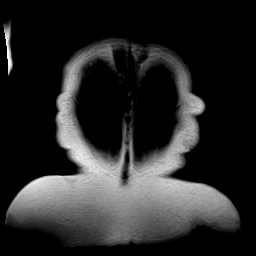
[im 26/26]
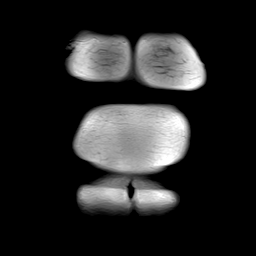

[Series 3: ax haste · axial · 6.0mm · 1.33mm/px · 1 of 32 slices shown]
[im 1/32]
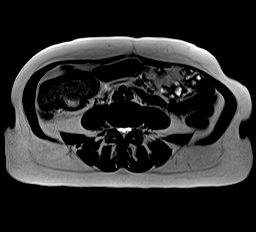

[Series 6: T2 fat-sat · axial · 6.0mm · 1.06mm/px · 1 of 30 slices shown]
[im 1/30]
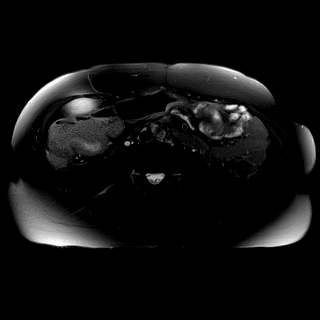

[Series 7: ax dwi_tracew · axial · 6.0mm · 1.27mm/px · z∈[-100,+108]mm · 4 of 90 slices shown]
[im 1/90]
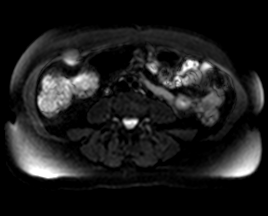
[im 30/90]
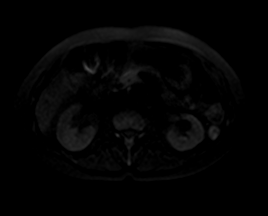
[im 60/90]
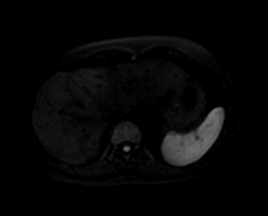
[im 90/90]
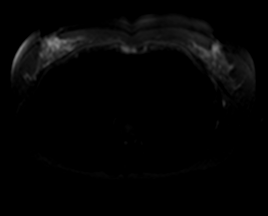

[Series 8: ax dwi_adc · axial · 6.0mm · 1.27mm/px · 1 of 30 slices shown]
[im 1/30]
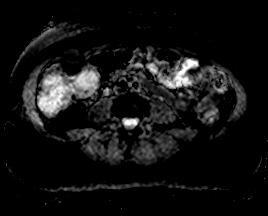

[Series 9: T1 · axial · 6.0mm · 0.66mm/px · 1 of 32 slices shown (1 of 2)]
[im 1/32]
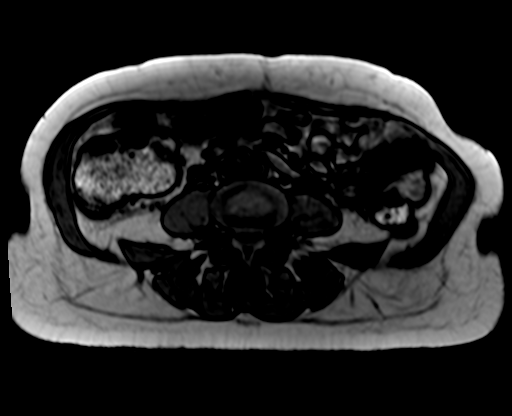

[Series 9: T1 · axial · 6.0mm · 0.66mm/px · 1 of 32 slices shown (2 of 2)]
[im 1/32]
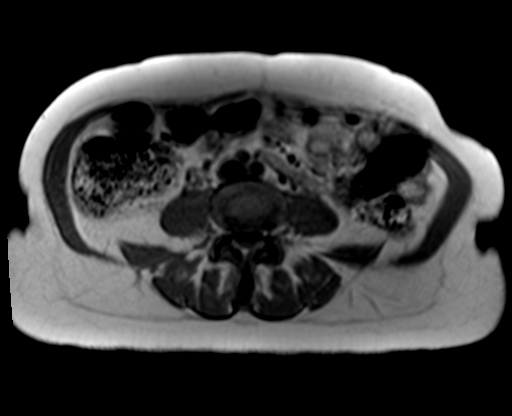

[Series 13: bSSFP · axial · 6.0mm · 0.66mm/px · 1 of 32 slices shown]
[im 1/32]
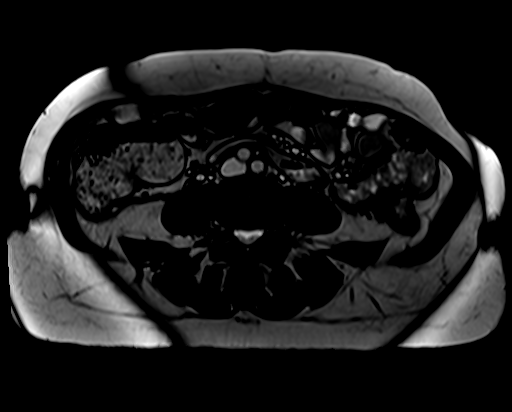

[Series 14: MRCP · coronal · 3.0mm · 1.06mm/px · 1 of 17 slices shown]
[im 1/17]
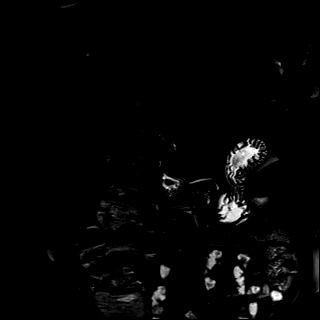

[Series 15: radials · coronal · 50.0mm · 0.78mm/px · 1 of 5 slices shown]
[im 1/5]
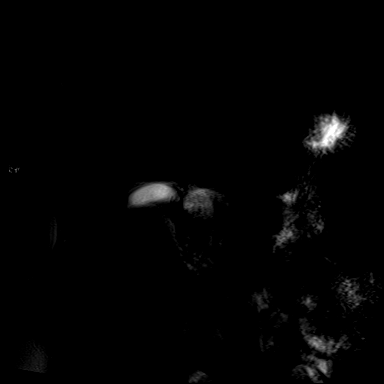

[Series 16: T1 dynamic fat-sat · axial · non-contrast · 3.0mm · 1.06mm/px · z∈[-102,+110]mm · 3 of 72 slices shown (1 of 5)]
[im 1/72]
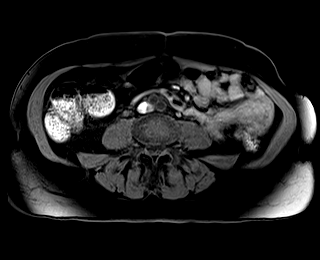
[im 36/72]
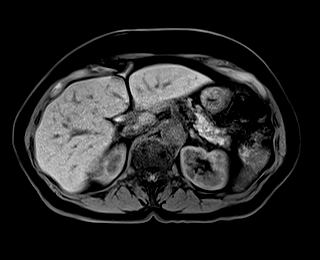
[im 72/72]
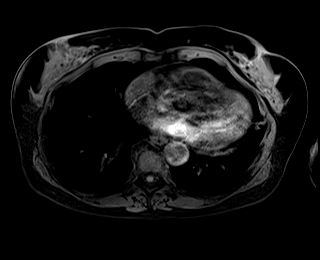

[Series 17: T1 dynamic fat-sat post-contrast · axial · 3.0mm · 1.06mm/px · z∈[-102,+110]mm · 3 of 72 slices shown (1 of 4)]
[im 1/72]
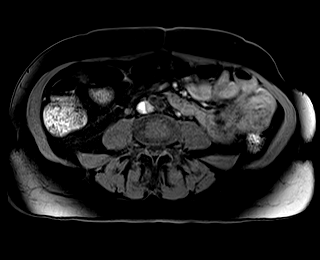
[im 36/72]
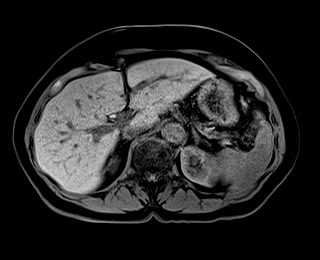
[im 72/72]
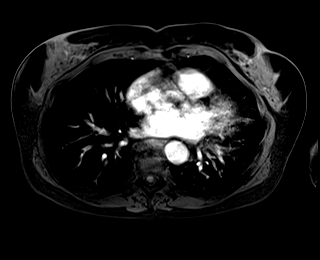

[Series 18: T1 dynamic fat-sat · axial · 3.0mm · 1.06mm/px · z∈[-102,+110]mm · 3 of 72 slices shown (2 of 5)]
[im 1/72]
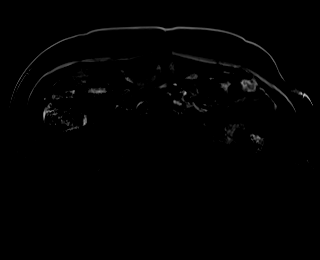
[im 36/72]
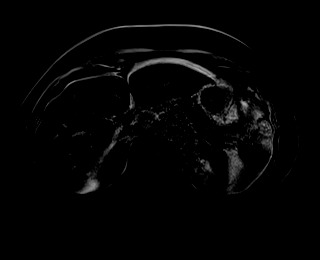
[im 72/72]
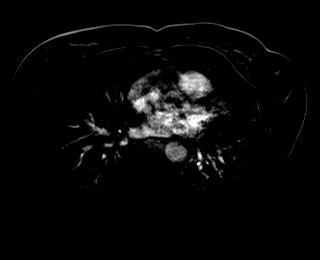

[Series 19: T1 dynamic fat-sat post-contrast · axial · 3.0mm · 1.06mm/px · z∈[-102,+110]mm · 3 of 72 slices shown (2 of 4)]
[im 1/72]
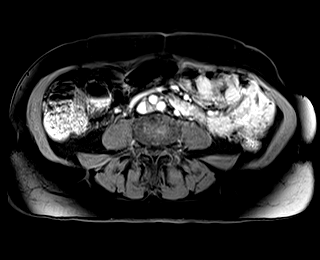
[im 36/72]
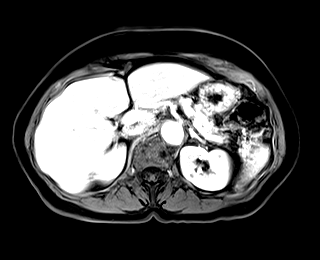
[im 72/72]
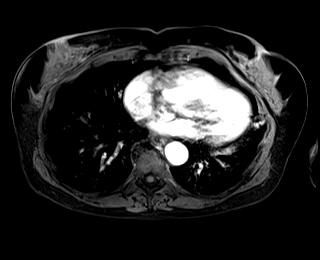

[Series 20: T1 dynamic fat-sat · axial · 3.0mm · 1.06mm/px · z∈[-102,+110]mm · 3 of 72 slices shown (3 of 5)]
[im 1/72]
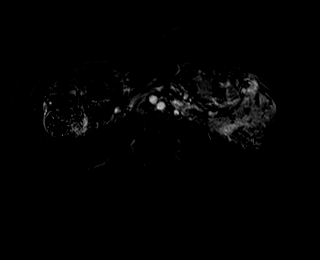
[im 36/72]
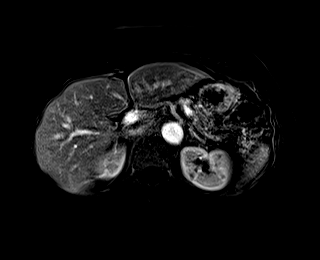
[im 72/72]
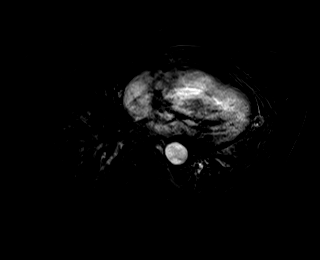

[Series 21: T1 dynamic fat-sat post-contrast · axial · 3.0mm · 1.06mm/px · z∈[-102,+110]mm · 3 of 72 slices shown (3 of 4)]
[im 1/72]
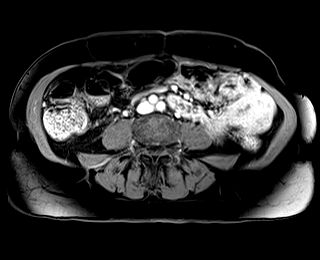
[im 36/72]
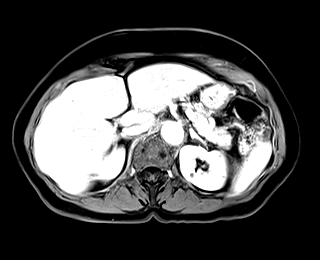
[im 72/72]
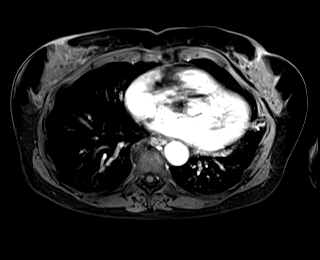

[Series 22: T1 dynamic fat-sat · axial · 3.0mm · 1.06mm/px · z∈[-102,+110]mm · 3 of 72 slices shown (4 of 5)]
[im 1/72]
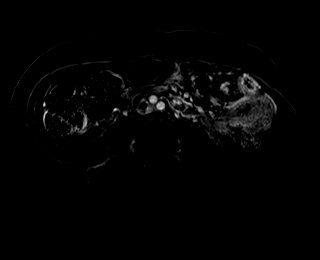
[im 36/72]
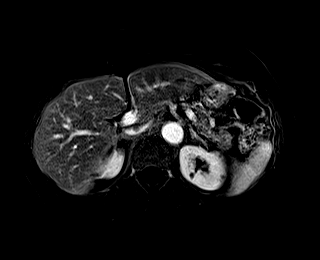
[im 72/72]
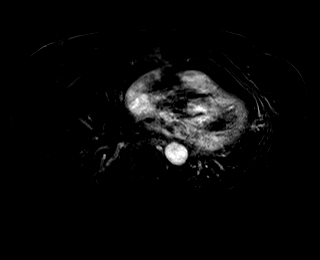

[Series 23: T1 dynamic post-contrast · coronal · 3.0mm · 1.06mm/px · 3 of 80 slices shown]
[im 1/80]
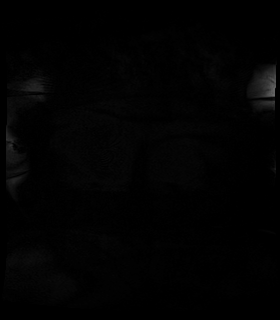
[im 40/80]
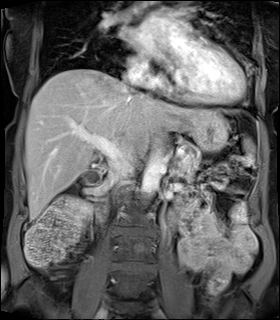
[im 80/80]
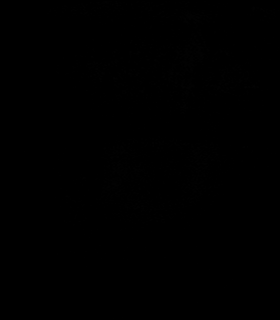

[Series 24: T1 dynamic fat-sat post-contrast · axial · 3.0mm · 1.06mm/px · z∈[-102,+110]mm · 3 of 72 slices shown (4 of 4)]
[im 1/72]
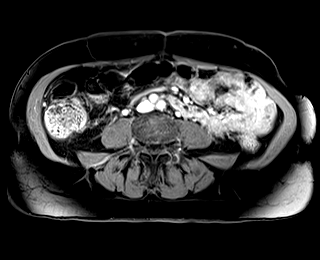
[im 36/72]
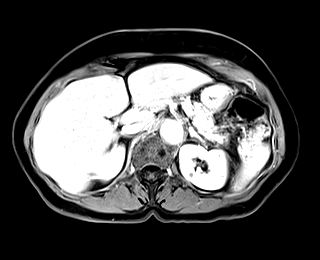
[im 72/72]
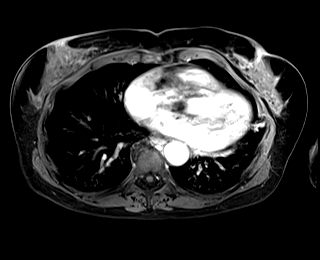

[Series 25: T1 dynamic fat-sat · axial · 3.0mm · 1.06mm/px · z∈[-102,+110]mm · 3 of 72 slices shown (5 of 5)]
[im 1/72]
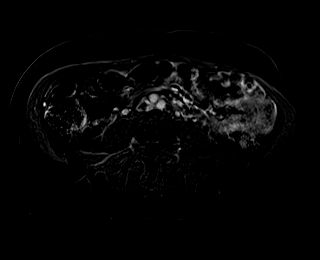
[im 36/72]
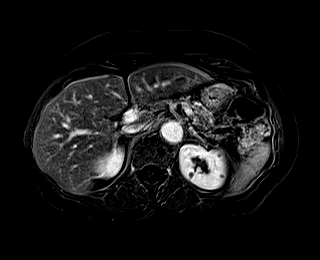
[im 72/72]
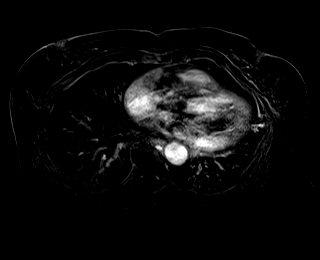

[44 of 48 positions shown; findings below may reference images not displayed]

FINDINGS: Lower chest: No acute findings.

Hepatobiliary: No hepatic masses identified. Gallbladder is
unremarkable. No evidence of biliary ductal dilatation.

Pancreas: A unilocular cystic lesion is seen in the pancreatic tail
which measures 11 x 9 mm on image [DATE]. This shows probable
communication with the main pancreatic duct, however there is no
evidence of pancreatic ductal dilatation or pancreas divisum. No
other pancreatic lesions identified. No evidence peripancreatic
inflammatory changes or fluid collections.

Spleen:  Within normal limits in size and appearance.

Adrenals/Urinary Tract: A few tiny sub-cm benign cysts are seen in
both kidneys, including a tiny Bosniak category 2 hemorrhagic cyst
in the posterior midpole the right kidney. No masses identified. No
evidence of hydronephrosis.

Stomach/Bowel: Visualized portion unremarkable.

Vascular/Lymphatic: No pathologically enlarged lymph nodes
identified. No acute vascular findings.

Other:  None.

Musculoskeletal:  No suspicious bone lesions identified.
IMPRESSION: 11 mm unilocular cystic lesion in the pancreatic tail, suspicious
for an indolent cystic neoplasm such as a side-branch IPMN.
Recommend continued follow-up by MRI in 2 years. This recommendation
follows ACR consensus guidelines: Management of Incidental
Pancreatic Cysts: A White Paper of the ACR Incidental Findings
Committee. [HOSPITAL] [id number];[DATE].

## 2021-12-22 ENCOUNTER — Telehealth: Payer: Self-pay

## 2021-12-22 DIAGNOSIS — K862 Cyst of pancreas: Secondary | ICD-10-CM

## 2021-12-22 NOTE — Telephone Encounter (Signed)
-----   Message from Jonathon Bellows, MD sent at 12/17/2021 12:25 PM EST ----- ?Regarding: RE: MRI Pancreas ?Thanks for the MRI reminder.  Go ahead and schedule the pancreas MRI ?----- Message ----- ?From: Wayna Chalet, [place] ?Sent: 12/17/2021  11:56 AM EST ?To: Jonathon Bellows, MD ?Subject: FW: MRI Pancreas                              ? ?Dr. Vicente Males, I had sent myself a message to remind myself to schedule a MRI of her pancreas. Do I go ahead and schedule it? Please advise. ? ? ?----- Message ----- ?From: Wayna Chalet, [place] ?Sent: 12/17/2021   8:00 AM EST ?To: Wayna Chalet, CMA ?Subject: FW: MRI Pancreas                              ? ? ?----- Message ----- ?From: Wayna Chalet, [place] ?Sent: 05/20/2021   4:40 PM EDT ?To: Wayna Chalet, CMA, Jonathon Bellows, MD ?Subject: MRI Pancreas                                  ? ?Patient needs an order for MRI of his pancreas per Dr. Vicente Males in March or April 2023. ? ? ? ? ?

## 2021-12-22 NOTE — Telephone Encounter (Signed)
Dr. Vicente Males wanted to order a MRI abdomen w wo contrast for March or April of this year. Therefore, it was ordered. Patient will be notified. ?

## 2022-01-01 ENCOUNTER — Other Ambulatory Visit: Payer: Medicare Other

## 2022-02-06 ENCOUNTER — Other Ambulatory Visit: Payer: Self-pay

## 2022-02-06 ENCOUNTER — Telehealth: Payer: Self-pay | Admitting: Family

## 2022-02-06 DIAGNOSIS — F32A Depression, unspecified: Secondary | ICD-10-CM

## 2022-02-06 MED ORDER — FLUOXETINE HCL 20 MG PO CAPS: 20.0000 mg | ORAL_CAPSULE | Freq: Every morning | ORAL | 3 refills | Status: AC

## 2022-02-06 NOTE — Telephone Encounter (Signed)
Pt need a refill of FLUoxetine sent to [facility] in [place] ?[address] [place]  ?

## 2022-02-06 NOTE — Telephone Encounter (Signed)
Pt called and notified that I did send Prozac to pharmacy requested. I let her know that she did need to establish elsewhere & she has been trying but having issues on [place]. She will be moving to a smaller town outside of [place] & will be trying to establish there. ?

## 2022-02-08 ENCOUNTER — Other Ambulatory Visit: Payer: Self-pay | Admitting: Family

## 2022-02-08 DIAGNOSIS — F32A Depression, unspecified: Secondary | ICD-10-CM

## 2022-05-19 ENCOUNTER — Telehealth: Payer: Self-pay | Admitting: Family

## 2022-05-19 NOTE — Telephone Encounter (Signed)
Spoke with patient she stated she moved to [place] and request records once she get her new pcp in January 2024

## 2022-12-24 ENCOUNTER — Other Ambulatory Visit: Payer: Self-pay | Admitting: Family

## 2022-12-24 DIAGNOSIS — I10 Essential (primary) hypertension: Secondary | ICD-10-CM

## 2022-12-25 NOTE — Telephone Encounter (Signed)
Spoke to pt and she has found new PCP where she moved to and she apologizes but we do not need to refill medication anymore! No longer a pt here.

## 2023-02-08 ENCOUNTER — Other Ambulatory Visit: Payer: Self-pay | Admitting: Family

## 2023-02-08 DIAGNOSIS — F32A Depression, unspecified: Secondary | ICD-10-CM
